# Patient Record
Sex: Female | Born: 1965
Health system: Southern US, Community
[De-identification: ages and names within clinical notes are randomized; demographics above are authoritative.]

## PROBLEM LIST (undated history)

## (undated) DIAGNOSIS — G43909 Migraine, unspecified, not intractable, without status migrainosus: Secondary | ICD-10-CM

## (undated) DIAGNOSIS — M3500 Sicca syndrome, unspecified: Secondary | ICD-10-CM

## (undated) DIAGNOSIS — F32A Depression, unspecified: Secondary | ICD-10-CM

## (undated) DIAGNOSIS — F419 Anxiety disorder, unspecified: Secondary | ICD-10-CM

## (undated) DIAGNOSIS — I7300 Raynaud's syndrome without gangrene: Secondary | ICD-10-CM

## (undated) DIAGNOSIS — E119 Type 2 diabetes mellitus without complications: Secondary | ICD-10-CM

## (undated) DIAGNOSIS — K219 Gastro-esophageal reflux disease without esophagitis: Secondary | ICD-10-CM

## (undated) DIAGNOSIS — M1990 Unspecified osteoarthritis, unspecified site: Secondary | ICD-10-CM

## (undated) DIAGNOSIS — G40909 Epilepsy, unspecified, not intractable, without status epilepticus: Secondary | ICD-10-CM

## (undated) DIAGNOSIS — R42 Dizziness and giddiness: Secondary | ICD-10-CM

## (undated) DIAGNOSIS — E7800 Pure hypercholesterolemia, unspecified: Secondary | ICD-10-CM

## (undated) DIAGNOSIS — E079 Disorder of thyroid, unspecified: Secondary | ICD-10-CM

## (undated) DIAGNOSIS — T7840XA Allergy, unspecified, initial encounter: Secondary | ICD-10-CM

## (undated) DIAGNOSIS — B029 Zoster without complications: Secondary | ICD-10-CM

## (undated) DIAGNOSIS — I10 Essential (primary) hypertension: Secondary | ICD-10-CM

## (undated) HISTORY — DX: Allergy, unspecified, initial encounter: T78.40XA

## (undated) HISTORY — DX: Type 2 diabetes mellitus without complications: E11.9

## (undated) HISTORY — DX: Disorder of thyroid, unspecified: E07.9

## (undated) HISTORY — DX: Depression, unspecified: F32.A

## (undated) HISTORY — DX: Raynaud's syndrome without gangrene: I73.00

## (undated) HISTORY — DX: Essential (primary) hypertension: I10

## (undated) HISTORY — DX: Pure hypercholesterolemia, unspecified: E78.00

## (undated) HISTORY — DX: Epilepsy, unspecified, not intractable, without status epilepticus: G40.909

## (undated) HISTORY — DX: Unspecified osteoarthritis, unspecified site: M19.90

## (undated) HISTORY — DX: Gastro-esophageal reflux disease without esophagitis: K21.9

## (undated) HISTORY — DX: Zoster without complications: B02.9

## (undated) HISTORY — DX: Dizziness and giddiness: R42

## (undated) HISTORY — DX: Migraine, unspecified, not intractable, without status migrainosus: G43.909

## (undated) HISTORY — DX: Sjogren syndrome, unspecified: M35.00

## (undated) HISTORY — DX: Anxiety disorder, unspecified: F41.9

---

## 2007-09-07 ENCOUNTER — Encounter: Payer: Self-pay | Admitting: Gastroenterology

## 2007-09-07 HISTORY — PX: UPPER GI ENDOSCOPY: SHX6162

## 2007-10-04 ENCOUNTER — Encounter: Payer: Self-pay | Admitting: Gastroenterology

## 2007-10-14 HISTORY — PX: CHOLECYSTECTOMY: SHX55

## 2013-06-15 DIAGNOSIS — N200 Calculus of kidney: Secondary | ICD-10-CM

## 2013-06-15 HISTORY — DX: Calculus of kidney: N20.0

## 2015-08-12 ENCOUNTER — Ambulatory Visit (INDEPENDENT_AMBULATORY_CARE_PROVIDER_SITE_OTHER): Payer: BLUE CROSS/BLUE SHIELD | Admitting: Obstetrics and Gynecology

## 2015-08-12 ENCOUNTER — Encounter: Payer: Self-pay | Admitting: Obstetrics and Gynecology

## 2015-08-12 VITALS — BP 138/82 | HR 80 | Resp 16 | Ht 62.5 in | Wt 157.4 lb

## 2015-08-12 DIAGNOSIS — N951 Menopausal and female climacteric states: Secondary | ICD-10-CM

## 2015-08-12 DIAGNOSIS — R42 Dizziness and giddiness: Secondary | ICD-10-CM

## 2015-08-12 DIAGNOSIS — F411 Generalized anxiety disorder: Secondary | ICD-10-CM | POA: Diagnosis not present

## 2015-08-12 NOTE — Patient Instructions (Addendum)
Venlafaxine extended-release capsules  What is this medicine?  VENLAFAXINE(VEN la fax een) is used to treat depression, anxiety and panic disorder.  This medicine may be used for other purposes; ask your health care provider or pharmacist if you have questions.  What should I tell my health care provider before I take this medicine?  They need to know if you have any of these conditions:  -bleeding disorders  -glaucoma  -heart disease  -high blood pressure  -high cholesterol  -kidney disease  -liver disease  -low levels of sodium in the blood  -mania or bipolar disorder  -seizures  -suicidal thoughts, plans, or attempt; a previous suicide attempt by you or a family  -take medicines that treat or prevent blood clots  -thyroid disease  -an unusual or allergic reaction to venlafaxine, desvenlafaxine, other medicines, foods, dyes, or preservatives  -pregnant or trying to get pregnant  -breast-feeding  How should I use this medicine?  Take this medicine by mouth with a full glass of water. Follow the directions on the prescription label. Do not cut, crush, or chew this medicine. Take it with food. If needed, the capsule may be carefully opened and the entire contents sprinkled on a spoonful of cool applesauce. Swallow the applesauce/pellet mixture right away without chewing and follow with a glass of water to ensure complete swallowing of the pellets. Try to take your medicine at about the same time each day. Do not take your medicine more often than directed. Do not stop taking this medicine suddenly except upon the advice of your doctor. Stopping this medicine too quickly may cause serious side effects or your condition may worsen.  A special MedGuide will be given to you by the pharmacist with each prescription and refill. Be sure to read this information carefully each time.  Talk to your pediatrician regarding the use of this medicine in children. Special care may be needed.  Overdosage: If you think you have taken  too much of this medicine contact a poison control center or emergency room at once.  NOTE: This medicine is only for you. Do not share this medicine with others.  What if I miss a dose?  If you miss a dose, take it as soon as you can. If it is almost time for your next dose, take only that dose. Do not take double or extra doses.  What may interact with this medicine?  Do not take this medicine with any of the following medications:  -certain medicines for fungal infections like fluconazole, itraconazole, ketoconazole, posaconazole, voriconazole  -cisapride  -desvenlafaxine  -dofetilide  -dronedarone  -duloxetine  -levomilnacipran  -linezolid  -MAOIs like Carbex, Eldepryl, Marplan, Nardil, and Parnate  -methylene blue (injected into a vein)  -milnacipran  -pimozide  -thioridazine  -ziprasidone  This medicine may also interact with the following medications:  -aspirin and aspirin-like medicines  -certain medicines for depression, anxiety, or psychotic disturbances  -certain medicines for migraine headaches like almotriptan, eletriptan, frovatriptan, naratriptan, rizatriptan, sumatriptan, zolmitriptan  -certain medicines for sleep  -certain medicines that treat or prevent blood clots like dalteparin, enoxaparin, warfarin  -cimetidine  -clozapine  -diuretics  -fentanyl  -furazolidone  -indinavir  -isoniazid  -lithium  -metoprolol  -NSAIDS, medicines for pain and inflammation, like ibuprofen or naproxen  -other medicines that prolong the QT interval (cause an abnormal heart rhythm)  -procarbazine  -rasagiline  -supplements like St. John's wort, kava kava, valerian  -tramadol  -tryptophan  This list may not describe all possible interactions.   care professional for regular checks on your progress. Because it may take several weeks to see the full effects of this medicine, it is important to continue your treatment as prescribed by your doctor. Patients and their families should watch out for new or worsening thoughts of suicide or depression. Also watch out for sudden changes in feelings such as feeling anxious, agitated, panicky, irritable, hostile, aggressive, impulsive, severely restless, overly excited and hyperactive, or not being able to sleep. If this happens, especially at the beginning of treatment or after a change in dose, call your health care professional. This medicine can cause an increase in blood pressure. Check with your doctor for instructions on monitoring your blood pressure while taking this medicine. You may get drowsy or dizzy. Do not drive, use machinery, or do anything that needs mental alertness until you know how this medicine affects you. Do not stand or sit up quickly, especially if you are an older patient. This reduces the risk of dizzy or fainting spells. Alcohol may interfere with the effect of this medicine. Avoid alcoholic drinks. Your mouth may get dry. Chewing sugarless gum, sucking hard candy and drinking plenty of water will help. Contact your doctor if the problem does not go away or is severe. What side effects may I notice from receiving this medicine? Side effects that you should report to your doctor or health care professional as soon as possible: -allergic reactions like skin rash, itching or hives, swelling of the face, lips, or tongue -breathing problems -changes in vision -hallucination, loss of contact with reality -seizures -suicidal thoughts or other mood changes -trouble passing urine or change in the amount of urine -unusual bleeding or bruising Side effects that usually do  not require medical attention (report to your doctor or health care professional if they continue or are bothersome): -change in sex drive or performance -constipation -increased sweating -loss of appetite -nausea -tremors -weight loss This list may not describe all possible side effects. Call your doctor for medical advice about side effects. You may report side effects to FDA at 1-800-FDA-1088. Where should I keep my medicine? Keep out of the reach of children. Store at a controlled temperature between 20 and 25 degrees C (68 degrees and 77 degrees F), in a dry place. Throw away any unused medicine after the expiration date. NOTE: This sheet is a summary. It may not cover all possible information. If you have questions about this medicine, talk to your doctor, pharmacist, or health care provider.    2016, Elsevier/Gold Standard. (2012-12-27 12:46:03)  Menopause and Herbal Products WHAT IS MENOPAUSE? Menopause is the normal time of life when menstrual periods decrease in frequency and eventually stop completely. This process can take several years for some women. Menopause is complete when you have had an absence of menstruation for a full year since your last menstrual period. It usually occurs between the ages of 30 and 20. It is not common for menopause to begin before the age of 94. During menopause, your body stops producing the female hormones estrogen and progesterone. Common symptoms associated with this loss of hormones (vasomotor symptoms) are:  Hot flashes.  Hot flushes.  Night sweats. Other common symptoms and complications of menopause include:  Decrease in sex drive.  Vaginal dryness and thinning of the walls of the vagina. This can make sex painful.  Dryness of the skin and development of wrinkles.  Headaches.  Tiredness.  Irritability.  Memory problems.  Weight gain.  Bladder infections.  Hair growth on the face and chest.  Inability to reproduce  offspring (infertility).  Loss of density in the bones (osteoporosis) increasing your risk for breaks (fractures).  Depression.  Hardening and narrowing of the arteries (atherosclerosis). This increases your risk of heart attack and stroke. WHAT TREATMENT OPTIONS ARE AVAILABLE? There are many treatment choices for menopause symptoms. The most common treatment is hormone replacement therapy. Many alternative therapies for menopause are emerging, including the use of herbal products. These supplements can be found in the form of herbs, teas, oils, tinctures, and pills. Common herbal supplements for menopause are made from plants that contain phytoestrogens. Phytoestrogens are compounds that occur naturally in plants and plant products. They act like estrogen in the body. Foods and herbs that contain phytoestrogens include:  Soy.  Flax seeds.  Red clover.  Ginseng. WHAT MENOPAUSE SYMPTOMS MAY BE HELPED IF I USE HERBAL PRODUCTS?  Vasomotor symptoms. These may be helped by:  Soy. Some studies show that soy may have a moderate benefit for hot flashes.  Black cohosh. There is limited evidence indicating this may be beneficial for hot flashes.  Symptoms that are related to heart and blood vessel disease. These may be helped by soy. Studies have shown that soy can help to lower cholesterol.  Depression. This may be helped by:  St. John's wort. There is limited evidence that shows this may help mild to moderate depression.  Black cohosh. There is evidence that this may help depression and mood swings.  Osteoporosis. Soy may help to decrease bone loss that is associated with menopause and may prevent osteoporosis. Limited evidence indicates that red clover may offer some bone loss protection as well. Other herbal products that are commonly used during menopause lack enough evidence to support their use as a replacement for conventional menopause therapies. These products include evening  primrose, ginseng, and red clover. WHAT ARE THE CASES WHEN HERBAL PRODUCTS SHOULD NOT BE USED DURING MENOPAUSE? Do not use herbal products during menopause without your health care provider's approval if:  You are taking medicine.  You have a preexisting liver condition. ARE THERE ANY RISKS IN MY TAKING HERBAL PRODUCTS DURING MENOPAUSE? If you choose to use herbal products to help with symptoms of menopause, keep in mind that:  Different supplements have different and unmeasured amounts of herbal ingredients.  Herbal products are not regulated the same way that medicines are.  Concentrations of herbs may vary depending on the way they are prepared. For example, the concentration may be different in a pill, tea, oil, and tincture.  Little is known about the risks of using herbal products, particularly the risks of long-term use.  Some herbal supplements can be harmful when combined with certain medicines. Most commonly reported side effects of herbal products are mild. However, if used improperly, many herbal supplements can cause serious problems. Talk to your health care provider before starting any herbal product. If problems develop, stop taking the supplement and let your health care provider know.   This information is not intended to replace advice given to you by your health care provider. Make sure you discuss any questions you have with your health care provider.   Document Released: 11/18/2007 Document Revised: 06/22/2014 Document Reviewed: 11/14/2013 Elsevier Interactive Patient Education [employer].

## 2015-08-12 NOTE — Progress Notes (Signed)
Patient ID: Chloe Pearson, female   DOB: 10-Sep-1965, 50 y.o.   MRN: [id number] GYNECOLOGY  VISIT   HPI: 50 y.o.   Single  Caucasian  female   [id number] with Patient's last menstrual period was 02/14/2015 (approximate).   here for consultation regarding menopausal issues. Feels "done" with doctors.  She brings in multiple records. Absolutely declines any examination today. Was referred to me by another patient.   1.  Concerned about her hormones. Was on HRT therapy for about 4 - 5 years, and would still have menses.  Tried multiple different forms of HRT and even low dose OCPs. Stopped LoLoEstrin Fe on March 01, 2015.  Spotting for one week in September 2016 with stopping the pills.  Prior LMP was April 2016.  Patient would have elevated blood pressure when she was on HRT.   Having hot flashes, night sweats. Declines any further hormonal treatment.  States she takes Metformin for glucose intolerance and for her hot flashes.   2.  Biggest issue today is migraine with vertigo instead of really a headache. This is what she is really here for today.  Takes OTC Meclizine, Zofran.   Sees provider at [place] at the vestibular clinic for the vertigo.  Has seen ENT and has normal hearing and no inner ear issues.  Has has imaging of the brain which is normal.   3.  Thinks she has endometriosis.  Had rectal bleeding with her menses.  No further rectal bleeding since she has stopped her cycles.  Wondering if hysterectomy would solve everything.   Saw Dr. Gaetano Net at [facility]. Normal pelvic ultrasound in March 2015.  EMB fragmented proliferative endometrium.   CT scan last February - scattered diverticula, renal stones, and hiatal hernia - type I.  4.  Having "surges." Feels something wanting to flow (menstrual blood) and it does not.  Lasts for days.  Not painful.  Has elevated blood pressure when this occurs.   Feels like she has the surges, elevated blood pressure and  vertigo at the same time.   5.  Had recent blood work with her PCP.  FSH 76.6 and estradiol < 0.5 on 07/24/15.  TSH 4.080.   6.  Feels she has some anxiety also since menopause.  Tried Birsdelle in the past for menopausal symptoms, and this did not work well.   GYNECOLOGIC HISTORY: Patient's last menstrual period was 02/14/2015 (approximate). Contraception:condoms Menopausal hormone therapy: none Last mammogram: 2016 normal;[facility] Last pap smear: 02/2014 normal per patient        OB History    Gravida Para Term Preterm AB TAB SAB Ectopic Multiple Living   0 0 0 0 0 0 0 0 0 0          There are no active problems to display for this patient.   Past Medical History  Diagnosis Date  . Anxiety   . Sjogren's disease ([place])   . Raynaud's disease   . Thyroid disease     related to Sjogrens  . Hypertension   . Vertigo   . Epilepsy [facility])     age 36 to 54    Past Surgical History  Procedure Laterality Date  . Cholecystectomy      Current Outpatient Prescriptions  Medication Sig Dispense Refill  . ALPRAZolam (XANAX) 0.25 MG tablet Take 1 tablet by mouth 2 (two) times daily.    . Blood Glucose Monitoring Suppl (ONE TOUCH ULTRA 2) w/Device KIT USE TO CHECK SUGAR TWICE DAILY  0  . BYSTOLIC 5 MG tablet 1/2 tab. Twice daly    . furosemide (LASIX) 20 MG tablet Take 1 tablet by mouth as needed.    . hydroxychloroquine (PLAQUENIL) 200 MG tablet Take 1 tablet by mouth daily.    Marland Kitchen levothyroxine (SYNTHROID, LEVOTHROID) 50 MCG tablet Take 50 mcg by mouth daily.  4  . metFORMIN (GLUCOPHAGE-XR) 500 MG 24 hr tablet Take 1 tablet by mouth daily.  5  . ondansetron (ZOFRAN-ODT) 4 MG disintegrating tablet Take 1 tablet by mouth as needed.    . pravastatin (PRAVACHOL) 20 MG tablet Take 20 mg by mouth daily.  3  . Vitamin D, Ergocalciferol, (DRISDOL) 50000 units CAPS capsule Take 50,000 Units by mouth once a week. Takes every other week  4   No current facility-administered medications  for this visit.     ALLERGIES: Oxycodone; Shellfish allergy; Cefaclor; and Penicillins  Family History  Problem Relation Age of Onset  . Osteoarthritis Mother   . Diabetes Mother   . Hypertension Mother   . Hyperlipidemia Mother   . Diabetes Father   . Hyperlipidemia Father   . Diabetes Paternal Aunt   . Diabetes Paternal Uncle   . Hypertension Maternal Grandmother   . Hyperlipidemia Maternal Grandmother   . Stroke Maternal Grandmother   . Diabetes Maternal Grandfather   . Hyperlipidemia Maternal Grandfather   . Hyperlipidemia Paternal Grandmother   . Hyperlipidemia Paternal Grandfather   . Stroke Paternal Grandfather   . Cancer Other   . Cancer Cousin     Social History   Social History  . Marital Status: Single    Spouse Name: N/A  . Number of Children: N/A  . Years of Education: N/A   Occupational History  . Not on file.   Social History Main Topics  . Smoking status: Former Smoker    Types: Cigarettes    Quit date: 08/13/2001  . Smokeless tobacco: Not on file  . Alcohol Use: No  . Drug Use: No  . Sexual Activity: No     Comment: condoms when needed   Other Topics Concern  . Not on file   Social History Narrative  . No narrative on file    ROS:  Pertinent items are noted in HPI.  PHYSICAL EXAMINATION:    BP 138/82 mmHg  Pulse 80  Resp 16  Ht 5' 2.5" (1.588 m)  Wt 157 lb 6.4 oz (71.396 kg)  BMI 28.31 kg/m2  LMP 02/14/2015 (Approximate)    General appearance: alert, cooperative and appears stated age.    Examination declined by patient.    ASSESSMENT  Menopausal symptoms. Labs support menopause but patient has not gone one full year without a cycle. Vertigo.   Migraines with vertigo.  Hx of rectal bleeding with prior menses.  Resolved now with cessation of menses. Anxiety and frustration.   PLAN  Very comprehensive discussion regarding menopausal symptoms, endometriosis, and vertigo.  We discussed options for treating menopausal  symptoms with HRT, SSRIs and SNRIs (offered Effexor XR which was declined), and herbal options.  We reviewed hysterectomy as a valuable surgical procedure when a gynecologic disease is diagnosed or strongly suspected following evaluation.  I offered a pelvic ultrasound to evaluate her anatomy.  She declines this as she states nothing had changed symptom wise. We did a literature search together on Up to Date about vertigo treatment, and I gave the patient a copy of this after we discussed it. I suggested she pursue another option from  a neurologist as she does not have this specialist on her team treating her migraines anymore.  Return prn.   An After Visit Summary was printed and given to the patient.  __60____ minutes face to face time of which over 50% was spent in counseling.

## 2015-08-26 ENCOUNTER — Ambulatory Visit (INDEPENDENT_AMBULATORY_CARE_PROVIDER_SITE_OTHER): Payer: BLUE CROSS/BLUE SHIELD | Admitting: Neurology

## 2015-08-26 ENCOUNTER — Encounter: Payer: Self-pay | Admitting: Neurology

## 2015-08-26 VITALS — BP 144/89 | HR 86 | Ht 62.0 in | Wt 158.0 lb

## 2015-08-26 DIAGNOSIS — E079 Disorder of thyroid, unspecified: Secondary | ICD-10-CM | POA: Diagnosis not present

## 2015-08-26 DIAGNOSIS — R42 Dizziness and giddiness: Secondary | ICD-10-CM

## 2015-08-26 DIAGNOSIS — M3500 Sicca syndrome, unspecified: Secondary | ICD-10-CM

## 2015-08-26 DIAGNOSIS — I10 Essential (primary) hypertension: Secondary | ICD-10-CM

## 2015-08-26 DIAGNOSIS — Z8669 Personal history of other diseases of the nervous system and sense organs: Secondary | ICD-10-CM

## 2015-08-26 DIAGNOSIS — F411 Generalized anxiety disorder: Secondary | ICD-10-CM | POA: Diagnosis not present

## 2015-08-26 DIAGNOSIS — G43109 Migraine with aura, not intractable, without status migrainosus: Secondary | ICD-10-CM | POA: Diagnosis not present

## 2015-08-26 DIAGNOSIS — I7300 Raynaud's syndrome without gangrene: Secondary | ICD-10-CM

## 2015-08-26 DIAGNOSIS — E785 Hyperlipidemia, unspecified: Secondary | ICD-10-CM

## 2015-08-26 DIAGNOSIS — G43809 Other migraine, not intractable, without status migrainosus: Secondary | ICD-10-CM

## 2015-08-26 MED ORDER — SUMATRIPTAN SUCCINATE 100 MG PO TABS: 100.0000 mg | ORAL_TABLET | Freq: Once | ORAL | Status: DC | PRN

## 2015-08-26 MED ORDER — DIVALPROEX SODIUM ER 250 MG PO TB24: 250.0000 mg | ORAL_TABLET | Freq: Every day | ORAL | Status: DC

## 2015-08-26 MED ORDER — SCOPOLAMINE 1 MG/3DAYS TD PT72: 1.0000 | MEDICATED_PATCH | TRANSDERMAL | Status: DC

## 2015-08-26 NOTE — Progress Notes (Addendum)
[facility]    Provider:  Dr Jaynee Eagles Referring Provider: Ernestene Kiel, MD Primary Care Physician:  Ernestene Kiel, MD  CC:  Vertigo  HPI:  Chloe Pearson is a 50 y.o. female here as a referral from Dr. Laqueta Due for 10 years of vertigo. Past medical history hypertension, high cholesterol, migraine, anxiety, thyroid disease, migraine.  She also has Sjogren's and Raynaud's syndrome and a history of epilepsy as a child. She has been to [facility] and to multiple neurologists. Getting worse as she gets closer to menopause. She was diagnosed with vestibular migraines with vertigo. Dizziness has occurred with migraines. The headaches are with photophobia and phonophobia. She feels like she is spinning. She feels like she is leaning to the right and isn't, like she is on a tilt-a-whirl. The vertigo will start slowly and progress over days then last all day long and all night and then slowly decrease over a week. This happenens over 2 weeks a month she will have varying amounts of vertigo. Worsening. Almost fell three times last Monday. No hearing changes. No food triggers or any triggers. Every once in a while she has migraines with the vertigo.She has had topamax and made her fingers tingle. OTC meclizine and zofran when it is really bad.  She gets arm tingling in the fingers with the headaches. Nothing makes the symptoms better. No inciting events or head trauma. No other focal neurologic deficits or symptoms. She has been evaluated by ENT without etiology for her vertigo, and she has had imaging of the brain which is normal.  Reviewed notes, labs and imaging from outside physicians, which showed: Patient was seen by OB/GYN on fibular 27th 2017. She reported being on hormone therapy for about 4-5 years with ongoing menses. Tried different forms of hormone replacement therapy and even low doses of OCPs. Last menstrual period was April 2016. She is having hot flashes and night sweats.  Declines any further hormonal treatment. She takes metformin for glucose intolerance and for her hot flashes. She was seen at [facility] at the vestibular clinic for her vertigo. She seen an ear nose and throat has normal hearing and no inner ear issues. She has had imaging of the brain which is normal. Normal pelvic ultrasound in March 2015. Fragmented pleura furtive endometrium. CT scan last February with scattered diverticula, renal stones and hiatal hernia type I. OB/GYN, Dr. Judeth Horn, discussed medications such as SSRIs and Effexor which were declined. She was offered pelvic ultrasound and declined.  TSH 4.080. Follows with primary care for thyroid management.  MRI of the brain from [facility], reviewed report. Stable and normal cerebral volume. Major intracranial vascular flow voids are stable within normal limits. No restricted diffusion. No midline shift, mass effect, evidence of mass lesion, ventriculomegaly, extra-axial collection or acute intracranial hemorrhage. Cervical medullary junction and pituitary are within normal limits. Negative visualized cervical spine. Gray-white matter signals within normal limits throughout the brain. No abnormal enhancement identified. Stable in normal MRI appearance of the brain. 03/14/2013.  Echocardiogram 03/14/2013. Normal left ventricular size and systolic function. Trace aortic and mitral insufficiency with no significant structural abnormalities identified. Mild TR, normal pulmonary artery pressure suggested. Aortic root and arch appear normal, no aneurysms or dissection, generally unremarkable echocardiographic study.  Bilateral carotid duplex ultrasound performed at [facility] on 01/02/2010. Mild amount of plaque or intimal thickening in the carotid arteries. No significant carotid artery stenosis. Estimated degree of stenosis in the internal carotid arteries is 50-69% based on the peak systolic velocities  which is likely an over estimation and  probably less than 50%.  Labs from 07/24/2015: Normal CBC and normal CMP. Creatinine 0.85. CK 96. Hemoglobin A1c 6.2. LDL 121. Sedimentation rate 3. TSH 4.080. Vitamin D 25-hydroxy normal.  Review of Systems: Patient complains of symptoms per HPI as well as the following symptoms: Weight gain, fatigue, blurred vision, easy bruising, feeling hot, feeling cold, joint pain, diarrhea, constipation number ringing in ears, spinning sensation, itching, allergies, skin sensitivity, headache, numbness, dizziness, anxiety, decreased energy, sleepiness. Pertinent negatives per HPI. All others negative.   Social History   Social History  . Marital Status: Single    Spouse Name: N/A  . Number of Children: 0  . Years of Education: 13   Occupational History  . [employer]    Social History Main Topics  . Smoking status: Former Smoker    Types: Cigarettes    Quit date: 08/13/2001  . Smokeless tobacco: Not on file  . Alcohol Use: 0.0 oz/week    0 Standard drinks or equivalent per week     Comment: rare  . Drug Use: No  . Sexual Activity: No     Comment: condoms when needed   Other Topics Concern  . Not on file   Social History Narrative   Lives alone   Caffeine use: Coffee-1 cup per day (2 cups on Friday)    Family History  Problem Relation Age of Onset  . Osteoarthritis Mother   . Diabetes Mother   . Hypertension Mother   . Hyperlipidemia Mother   . Diabetes Father   . Hyperlipidemia Father   . Diabetes Paternal Aunt   . Diabetes Paternal Uncle   . Hypertension Maternal Grandmother   . Hyperlipidemia Maternal Grandmother   . Stroke Maternal Grandmother   . Diabetes Maternal Grandfather   . Hyperlipidemia Maternal Grandfather   . Hyperlipidemia Paternal Grandmother   . Hyperlipidemia Paternal Grandfather   . Stroke Paternal Grandfather   . Cancer Other   . Cancer Cousin   . Migraines Neg Hx     Past Medical History  Diagnosis Date  . Anxiety   . Sjogren's disease ([place])    . Raynaud's disease   . Thyroid disease     related to Sjogrens  . Hypertension   . Vertigo   . Epilepsy [facility])     age 38 to 12  . Hypertension   . Migraine   . High cholesterol     Past Surgical History  Procedure Laterality Date  . Cholecystectomy  about 2007    Current Outpatient Prescriptions  Medication Sig Dispense Refill  . acetaminophen (TYLENOL) 500 MG tablet Take 500 mg by mouth every 6 (six) hours as needed.    . ALPRAZolam (XANAX) 0.25 MG tablet Take 1 tablet by mouth 2 (two) times daily.    . Blood Glucose Monitoring Suppl (ONE TOUCH ULTRA 2) w/Device KIT USE TO CHECK SUGAR TWICE DAILY  0  . BYSTOLIC 5 MG tablet 1/2 tab. Twice daly    . furosemide (LASIX) 20 MG tablet Take 1 tablet by mouth as needed.    . hydroxychloroquine (PLAQUENIL) 200 MG tablet Take 1 tablet by mouth daily.    Marland Kitchen levothyroxine (SYNTHROID, LEVOTHROID) 50 MCG tablet Take 50 mcg by mouth daily.  4  . metFORMIN (GLUCOPHAGE-XR) 500 MG 24 hr tablet Take 1 tablet by mouth daily.  5  . ondansetron (ZOFRAN-ODT) 4 MG disintegrating tablet Take 1 tablet by mouth as needed.    Marland Kitchen  pravastatin (PRAVACHOL) 20 MG tablet Take 20 mg by mouth daily.  3  . Vitamin D, Ergocalciferol, (DRISDOL) 50000 units CAPS capsule Take 50,000 Units by mouth once a week. Takes every other week  4  . divalproex (DEPAKOTE ER) 250 MG 24 hr tablet Take 1 tablet (250 mg total) by mouth daily. 30 tablet 12  . scopolamine (TRANSDERM-SCOP) 1 MG/3DAYS Place 1 patch (1.5 mg total) onto the skin every 3 (three) days. 10 patch 12  . SUMAtriptan (IMITREX) 100 MG tablet Take 1 tablet (100 mg total) by mouth once as needed for migraine. May repeat in 2 hours if headache persists or recurs. 12 tablet 11   No current facility-administered medications for this visit.    Allergies as of 08/26/2015 - Review Complete 08/26/2015  Allergen Reaction Noted  . Oxycodone Itching 08/12/2015  . Shellfish allergy Swelling 08/12/2015  . Cefaclor Rash  08/12/2015  . Penicillins Rash 08/12/2015    Vitals: BP 144/89 mmHg  Pulse 86  Ht _0  (1.575 m)  Wt 158 lb (71.668 kg)  BMI 28.89 kg/m2  LMP 02/14/2015 (Approximate) Last Weight:  Wt Readings from Last 1 Encounters:  08/26/15 158 lb (71.668 kg)   Last Height:   Ht Readings from Last 1 Encounters:  08/26/15 _1  (1.575 m)    Physical exam: Exam: Gen: NAD, conversant, well nourised, obese, well groomed                     CV: RRR, no MRG. No Carotid Bruits. No peripheral edema, warm, nontender Eyes: Conjunctivae clear without exudates or hemorrhage  Neuro: Detailed Neurologic Exam  Speech:    Speech is normal; fluent and spontaneous with normal comprehension.  Cognition:    The patient is oriented to person, place, and time;     recent and remote memory intact;     language fluent;     normal attention, concentration,     fund of knowledge Cranial Nerves:    The pupils are equal, round, and reactive to light. The fundi are normal and spontaneous venous pulsations are present. Visual fields are full to finger confrontation. Extraocular movements are intact. Trigeminal sensation is intact and the muscles of mastication are normal. The face is symmetric. The palate elevates in the midline. Hearing intact. Voice is normal. Shoulder shrug is normal. The tongue has normal motion without fasciculations.   Coordination:    Normal finger to nose and heel to shin. Normal rapid alternating movements.   Gait:    Heel-toe and tandem gait are normal.   Motor Observation:    No asymmetry, no atrophy, and no involuntary movements noted. Tone:    Normal muscle tone.    Posture:    Posture is normal. normal erect    Strength:    Strength is V/V in the upper and lower limbs.      Sensation: intact to LT     Reflex Exam:  DTR's:    Deep tendon reflexes in the upper and lower extremities are normal bilaterally.   Toes:    The toes are downgoing bilaterally.   Clonus:     Clonus is absent.      Assessment/Plan:  50 year old patient with 10 year history of vertigo associated with migraines. She has been evaluated by multiple neurologists and the [facility] vestibular clinic. She's been diagnosed with vestibular migraines.  vestibular therapy: [employer] on [address] in [place] at onset of symptoms  As far as your  medications are concerned, I would like to suggest: Imitrex at the onset of symptoms. Can repeat it in 2 hours. No more than 2 in a day. Durin the week of menses when vertigo occurs, may take 2 daily for up to 5 days. Scopolamine patch when symptoms are severe.  Depakote 259m at night before bed. Can increase to 5036m   Depakote: She is not planning on having any children, she is menopausal. Discussed teratogenicity of Depakote, do not get pregnant, use BC. Discussed side effects and provided an UpToDate patient drug information handout. Side effects of Depakote include drowsiness, weakness, nausea, vomiting, stomach upset, diarrhea, constipation, mood swings, changes in menstrual periods, enlarged breasts, weight changes, agitation, tremor (shaking), vision changes, unusual or unpleasant taste in your mouth, and hair loss.  Triptan: The most common side-effects are feeling sick (nausea), dizziness and dry mouth. In addition, triptans can also cause some people to experience strange sensations. These may be a tightness, tingling, flushing, and feelings of heaviness or pressure in areas such as the face and limbs, and occasionally the chest. Serious side effects can include stroke, cardiac side effects such as chest tightness, shortness of breath and possible cardiovascular adverse effects.   Addendum 10/01/2015: [facility] MRI of the head with and without contrast showed stable in normal cerebral volume, major intracranial vascular flow voids are stable and within normal limits,  no restricted diffusion to suggest acute infarct, no midline shift, mass effect, no evidence of mass lesion, ventriculomegaly, extra-axial collection or acute intracranial hemorrhage. Cervical medullary junction and pituitary are within normal limits. Negative visualized cervical spine. Gray-white matter signal is within normal limits throughout the brain. No abnormal enhancement is identified. Visualized orbit soft tissues are within normal limits. Visualized para nasal sinuses and mastoids are clear. Visible internal auditory structures appear normal. Negative scalp soft tissue, bone marrow signal is normal. Pearson 09/10/2009. Stable in normal MRI appearance of the brain.  Same-day upper extremity artery unilateral: Unremarkable sonographic appearance of the bilateral subclavian arteries. Ordered for intermittent dizziness of 5 years worsened over the past month. She presented for bilateral duplex carotid ultrasonography was found to have an asymmetric midline bruit concerning for possible subclavian stenosis. Limited evaluation of the bilateral upper extremities, subclavian arteries, was performed in addition to the carotid duplex study. Bilateral carotid duplex ultrasound comparison 01/02/2010 no evidence for carotid artery stenosis and no significant plaque identified.  CC: Dr. PrFonnie BirkenheadMD  [facility] [address] [address]Phone [phone]

## 2015-08-26 NOTE — Patient Instructions (Addendum)
Remember to drink plenty of fluid, eat healthy meals and do not skip any meals. Try to eat protein with a every meal and eat a healthy snack such as fruit or nuts in between meals. Try to keep a regular sleep-wake schedule and try to exercise daily, particularly in the form of walking, 20-30 minutes a day, if you can.   As far as your medications are concerned, I would like to suggest: Imitrex at the onset of symptoms. Can repeat it in 2 hours. No more than 2 in a day. Scopolamine patch when symptoms are severe.  Depakote 250mg  at night before bed  Our phone number is [phone]. We also have an after hours call service for urgent matters and there is a physician on-call for urgent questions. For any emergencies you know to call 911 or go to the nearest emergency room

## 2015-08-30 ENCOUNTER — Encounter: Payer: Self-pay | Admitting: Neurology

## 2015-08-30 DIAGNOSIS — I10 Essential (primary) hypertension: Secondary | ICD-10-CM | POA: Insufficient documentation

## 2015-08-30 DIAGNOSIS — G43809 Other migraine, not intractable, without status migrainosus: Secondary | ICD-10-CM | POA: Insufficient documentation

## 2015-08-30 DIAGNOSIS — E079 Disorder of thyroid, unspecified: Secondary | ICD-10-CM | POA: Insufficient documentation

## 2015-08-30 DIAGNOSIS — I7300 Raynaud's syndrome without gangrene: Secondary | ICD-10-CM | POA: Insufficient documentation

## 2015-08-30 DIAGNOSIS — G43109 Migraine with aura, not intractable, without status migrainosus: Principal | ICD-10-CM

## 2015-08-30 DIAGNOSIS — F411 Generalized anxiety disorder: Secondary | ICD-10-CM | POA: Insufficient documentation

## 2015-08-30 DIAGNOSIS — M3500 Sicca syndrome, unspecified: Secondary | ICD-10-CM | POA: Insufficient documentation

## 2015-08-30 DIAGNOSIS — Z8669 Personal history of other diseases of the nervous system and sense organs: Secondary | ICD-10-CM | POA: Insufficient documentation

## 2015-08-30 DIAGNOSIS — E785 Hyperlipidemia, unspecified: Secondary | ICD-10-CM | POA: Insufficient documentation

## 2015-09-01 ENCOUNTER — Encounter: Payer: Self-pay | Admitting: Neurology

## 2015-09-04 ENCOUNTER — Other Ambulatory Visit: Payer: Self-pay | Admitting: Neurology

## 2015-09-04 MED ORDER — RIZATRIPTAN BENZOATE 10 MG PO TBDP: 10.0000 mg | ORAL_TABLET | Freq: Once | ORAL | Status: DC

## 2015-11-26 ENCOUNTER — Ambulatory Visit (INDEPENDENT_AMBULATORY_CARE_PROVIDER_SITE_OTHER): Payer: BLUE CROSS/BLUE SHIELD | Admitting: Neurology

## 2015-11-26 VITALS — BP 146/94 | HR 80 | Ht 62.0 in | Wt 161.0 lb

## 2015-11-26 DIAGNOSIS — R0683 Snoring: Secondary | ICD-10-CM

## 2015-11-26 DIAGNOSIS — R400 Somnolence: Secondary | ICD-10-CM

## 2015-11-26 DIAGNOSIS — R51 Headache: Secondary | ICD-10-CM | POA: Diagnosis not present

## 2015-11-26 DIAGNOSIS — G43109 Migraine with aura, not intractable, without status migrainosus: Secondary | ICD-10-CM | POA: Diagnosis not present

## 2015-11-26 DIAGNOSIS — G4710 Hypersomnia, unspecified: Secondary | ICD-10-CM

## 2015-11-26 DIAGNOSIS — G43809 Other migraine, not intractable, without status migrainosus: Secondary | ICD-10-CM

## 2015-11-26 DIAGNOSIS — R42 Dizziness and giddiness: Secondary | ICD-10-CM | POA: Diagnosis not present

## 2015-11-26 DIAGNOSIS — R519 Headache, unspecified: Secondary | ICD-10-CM

## 2015-11-26 MED ORDER — DIVALPROEX SODIUM ER 250 MG PO TB24: 500.0000 mg | ORAL_TABLET | Freq: Every day | ORAL | Status: DC

## 2015-11-26 NOTE — Progress Notes (Addendum)
[facility]    Provider:  Dr Jaynee Eagles Referring Provider: Ernestene Kiel, MD Primary Care Physician:  Ernestene Kiel, MD  CC: Vertigo  Addendum: had 5 treatments of vestibular therapy with improvement.   Interval history 11/26/2015: 50 year old patient with 10 year history of vertigo associated with migraines. She has been evaluated by multiple neurologists and the [facility] vestibular clinic. She's been diagnosed with vestibular migraines.The Depakote has helped she doesn't get the roller coaster feeling. The Imitrex increased BP.  Since march she has had 5 a month on average with the vertigo. The scopolamine patch was too expensive. The Depakote seems to be helping a little bit we discussed vestibular migraines or basilar type migraines. Patient suffered from these for many years and she's been evaluated multiple times even up to do vestibular clinic. Discussed other options. She does have Xanax which is a very good vestibular suppressant, I recommend trying that when she has the dizzy vertigo spells. Reglan is something else we can try. At this point we'll increase her Depakote and see if that helps with prevention.  Medications tried: Depakote (on now), tried multiple triptans and intolerant, Topamax made her fingers hurt and could not try it, lexapro (did not tolerate). Takes meclizine and zofran and xanax for symptoms of vertigo/dizziness. Acutely she takes tylenol and meclizine or zofran. Scopolamine was too expensive and didn't try it.    HPI: Chloe Pearson is a 50 y.o. female here as a referral from Dr. Laqueta Due for 10 years of vertigo. Past medical history hypertension, high cholesterol, migraine, anxiety, thyroid disease, migraine. She also has Sjogren's and Raynaud's syndrome and a history of epilepsy as a child. She has been to [facility] and to multiple neurologists. Getting worse as she gets closer to menopause. She was diagnosed with vestibular migraines with vertigo.  Dizziness has occurred with migraines. The headaches are with photophobia and phonophobia. She feels like she is spinning. She feels like she is leaning to the right and isn't, like she is on a tilt-a-whirl. The vertigo will start slowly and progress over days then last all day long and all night and then slowly decrease over a week. This happenens over 2 weeks a month she will have varying amounts of vertigo. Worsening. Almost fell three times last Monday. No hearing changes. No food triggers or any triggers. Every once in a while she has migraines with the vertigo.She has had topamax and made her fingers tingle. OTC meclizine and zofran when it is really bad. She gets arm tingling in the fingers with the headaches. Nothing makes the symptoms better. No inciting events or head trauma. No other focal neurologic deficits or symptoms. She has been evaluated by ENT without etiology for her vertigo, and she has had imaging of the brain which is normal.  Reviewed notes, labs and imaging from outside physicians, which showed: Patient was seen by OB/GYN on fibular 27th 2017. She reported being on hormone therapy for about 4-5 years with ongoing menses. Tried different forms of hormone replacement therapy and even low doses of OCPs. Last menstrual period was April 2016. She is having hot flashes and night sweats. Declines any further hormonal treatment. She takes metformin for glucose intolerance and for her hot flashes. She was seen at [facility] at the vestibular clinic for her vertigo. She seen an ear nose and throat has normal hearing and no inner ear issues. She has had imaging of the brain which is normal. Normal pelvic ultrasound in March 2015. Fragmented pleura furtive endometrium.  CT scan last February with scattered diverticula, renal stones and hiatal hernia type I. OB/GYN, Dr. Judeth Horn, discussed medications such as SSRIs and Effexor which were declined. She was offered pelvic ultrasound and declined.  TSH 4.080.  Follows with primary care for thyroid management.  MRI of the brain from [facility], reviewed report. Stable and normal cerebral volume. Major intracranial vascular flow voids are stable within normal limits. No restricted diffusion. No midline shift, mass effect, evidence of mass lesion, ventriculomegaly, extra-axial collection or acute intracranial hemorrhage. Cervical medullary junction and pituitary are within normal limits. Negative visualized cervical spine. Gray-white matter signals within normal limits throughout the brain. No abnormal enhancement identified. Stable in normal MRI appearance of the brain. 03/14/2013.  Echocardiogram 03/14/2013. Normal left ventricular size and systolic function. Trace aortic and mitral insufficiency with no significant structural abnormalities identified. Mild TR, normal pulmonary artery pressure suggested. Aortic root and arch appear normal, no aneurysms or dissection, generally unremarkable echocardiographic study.  Bilateral carotid duplex ultrasound performed at [facility] on 01/02/2010. Mild amount of plaque or intimal thickening in the carotid arteries. No significant carotid artery stenosis. Estimated degree of stenosis in the internal carotid arteries is 50-69% based on the peak systolic velocities which is likely an over estimation and probably less than 50%.  Labs from 07/24/2015: Normal CBC and normal CMP. Creatinine 0.85. CK 96. Hemoglobin A1c 6.2. LDL 121. Sedimentation rate 3. TSH 4.080. Vitamin D 25-hydroxy normal.  Review of Systems: Patient complains of symptoms per HPI as well as the following symptoms: Weight gain, fatigue, blurred vision, easy bruising, feeling hot, feeling cold, joint pain, diarrhea, constipation number ringing in ears, spinning sensation, itching, allergies, skin sensitivity, headache, numbness, dizziness, anxiety, decreased energy, sleepiness. Pertinent negatives per HPI. All others negative.  Social History     Social History  . Marital Status: Single    Spouse Name: N/A  . Number of Children: 0  . Years of Education: 13   Occupational History  . [employer]    Social History Main Topics  . Smoking status: Former Smoker    Types: Cigarettes    Quit date: 08/13/2001  . Smokeless tobacco: Not on file  . Alcohol Use: 0.0 oz/week    0 Standard drinks or equivalent per week     Comment: rare  . Drug Use: No  . Sexual Activity: No     Comment: condoms when needed   Other Topics Concern  . Not on file   Social History Narrative   Lives alone   Caffeine use: Coffee-1 cup per day (2 cups on Friday)    Family History  Problem Relation Age of Onset  . Osteoarthritis Mother   . Diabetes Mother   . Hypertension Mother   . Hyperlipidemia Mother   . Diabetes Father   . Hyperlipidemia Father   . Diabetes Paternal Aunt   . Diabetes Paternal Uncle   . Hypertension Maternal Grandmother   . Hyperlipidemia Maternal Grandmother   . Stroke Maternal Grandmother   . Diabetes Maternal Grandfather   . Hyperlipidemia Maternal Grandfather   . Hyperlipidemia Paternal Grandmother   . Hyperlipidemia Paternal Grandfather   . Stroke Paternal Grandfather   . Cancer Other   . Cancer Cousin   . Migraines Neg Hx     Past Medical History  Diagnosis Date  . Anxiety   . Sjogren's disease ([place])   . Raynaud's disease   . Thyroid disease     related to Sjogrens  . Hypertension   .  Vertigo   . Epilepsy [facility])     age 34 to 80  . Hypertension   . Migraine   . High cholesterol     Past Surgical History  Procedure Laterality Date  . Cholecystectomy  about 2007    Current Outpatient Prescriptions  Medication Sig Dispense Refill  . acetaminophen (TYLENOL) 500 MG tablet Take 500 mg by mouth every 6 (six) hours as needed.    . ALPRAZolam (XANAX) 0.25 MG tablet Take 1 tablet by mouth 2 (two) times daily.    . Blood Glucose Monitoring Suppl (ONE TOUCH ULTRA 2) w/Device KIT USE TO CHECK SUGAR TWICE  DAILY  0  . BYSTOLIC 5 MG tablet 1/2 tab. Twice daly    . divalproex (DEPAKOTE ER) 250 MG 24 hr tablet Take 1 tablet (250 mg total) by mouth daily. 30 tablet 12  . furosemide (LASIX) 20 MG tablet Take 1 tablet by mouth as needed.    . hydroxychloroquine (PLAQUENIL) 200 MG tablet Take 1 tablet by mouth daily.    Marland Kitchen levothyroxine (SYNTHROID, LEVOTHROID) 75 MCG tablet Take 75 mcg by mouth daily.  4  . metFORMIN (GLUCOPHAGE-XR) 500 MG 24 hr tablet Take 2 tablets by mouth 2 (two) times daily.   5  . ondansetron (ZOFRAN-ODT) 4 MG disintegrating tablet Take 1 tablet by mouth as needed.    . pravastatin (PRAVACHOL) 20 MG tablet Take 20 mg by mouth daily.  3  . Vitamin D, Ergocalciferol, (DRISDOL) 50000 units CAPS capsule Take 50,000 Units by mouth once a week. Takes every other week  4  . ONE TOUCH ULTRA TEST test strip USE TO CHECK SUAGR TWICE DAILY  99   No current facility-administered medications for this visit.    Allergies as of 11/26/2015 - Review Complete 11/26/2015  Allergen Reaction Noted  . Oxycodone Itching 08/12/2015  . Shellfish allergy Swelling 08/12/2015  . Cefaclor Rash 08/12/2015  . Penicillins Rash 08/12/2015    Vitals: BP 146/94 mmHg  Pulse 80  Ht 5' 2"  (1.575 m)  Wt 161 lb (73.029 kg)  BMI 29.44 kg/m2 Last Weight:  Wt Readings from Last 1 Encounters:  11/26/15 161 lb (73.029 kg)   Last Height:   Ht Readings from Last 1 Encounters:  11/26/15 5' 2"  (1.575 m)       Physical exam: Exam: Gen: NAD, conversant, well nourised, obese, well groomed  CV: RRR, no MRG. No Carotid Bruits. No peripheral edema, warm, nontender Eyes: Conjunctivae clear without exudates or hemorrhage  Neuro: Detailed Neurologic Exam  Speech:  Speech is normal; fluent and spontaneous with normal comprehension.  Cognition:  The patient is oriented to person, place, and time;   recent and remote memory intact;   language fluent;   normal attention,  concentration,   fund of knowledge Cranial Nerves:  The pupils are equal, round, and reactive to light. The fundi are normal and spontaneous venous pulsations are present. Visual fields are full to finger confrontation. Extraocular movements are intact. Trigeminal sensation is intact and the muscles of mastication are normal. The face is symmetric. The palate elevates in the midline. Hearing intact. Voice is normal. Shoulder shrug is normal. The tongue has normal motion without fasciculations.   Coordination:  Normal finger to nose and heel to shin. Normal rapid alternating movements.   Gait:  Heel-toe and tandem gait are normal.   Motor Observation:  No asymmetry, no atrophy, and no involuntary movements noted. Tone:  Normal muscle tone.   Posture:  Posture is  normal. normal erect   Strength:  Strength is V/V in the upper and lower limbs.    Sensation: intact to LT   Reflex Exam:  DTR's:  Deep tendon reflexes in the upper and lower extremities are normal bilaterally.  Toes:  The toes are downgoing bilaterally.  Clonus:  Clonus is absent.     Assessment/Plan: 50 year old patient with 10 year history of vertigo associated with migraines. She has been evaluated by multiple neurologists and the [facility] vestibular clinic. She's been diagnosed with vestibular migraines.  vestibular therapy: [facility] on [address] in [place]. She never heard from them will try again.  As far as your medications are concerned, I would like to suggest: Tried taking Xanax at the onset of next vestibular event as this is a very good vestibular suppressant. Depakote can increase to 560m.   Depakote: She is not planning on having any children, she is menopausal. Discussed teratogenicity of Depakote, do not get pregnant, use BC. Discussed side effects and provided an UpToDate patient drug information handout. Side effects of Depakote include  drowsiness, weakness, nausea, vomiting, stomach upset, diarrhea, constipation, mood swings, changes in menstrual periods, enlarged breasts, weight changes, agitation, tremor (shaking), vision changes, unusual or unpleasant taste in your mouth, and hair loss.  When she has vertigo, try and additional Xanax.     Addendum 10/01/2015: [facility] MRI of the head with and without contrast showed stable in normal cerebral volume, major intracranial vascular flow voids are stable and within normal limits, no restricted diffusion to suggest acute infarct, no midline shift, mass effect, no evidence of mass lesion, ventriculomegaly, extra-axial collection or acute intracranial hemorrhage. Cervical medullary junction and pituitary are within normal limits. Negative visualized cervical spine. Gray-white matter signal is within normal limits throughout the brain. No abnormal enhancement is identified. Visualized orbit soft tissues are within normal limits. Visualized para nasal sinuses and mastoids are clear. Visible internal auditory structures appear normal. Negative scalp soft tissue, bone marrow signal is normal. Pearson 09/10/2009. Stable in normal MRI appearance of the brain.  Same-day upper extremity artery unilateral: Unremarkable sonographic appearance of the bilateral subclavian arteries. Ordered for intermittent dizziness of 5 years worsened over the past month. She presented for bilateral duplex carotid ultrasonography was found to have an asymmetric midline bruit concerning for possible subclavian stenosis. Limited evaluation of the bilateral upper extremities, subclavian arteries, was performed in addition to the carotid duplex study. Bilateral carotid duplex ultrasound comparison 01/02/2010 no evidence for carotid artery stenosis and no significant plaque identified.  CC: Dr. PFonnie Birkenhead MD  [facility] [address] [address] Phone [phone] [phone] A total of 45 minutes was spent face-to-face with this patient. Over half this time was spent on counseling patient on the vestibular migraine diagnosis and different diagnostic and therapeutic options available.

## 2015-11-26 NOTE — Patient Instructions (Addendum)
Remember to drink plenty of fluid, eat healthy meals and do not skip any meals. Try to eat protein with a every meal and eat a healthy snack such as fruit or nuts in between meals. Try to keep a regular sleep-wake schedule and try to exercise daily, particularly in the form of walking, 20-30 minutes a day, if you can.   As far as your medications are concerned, I would like to suggest: Incease Depakote to 500mg  at night. Try Xanax for dizziness. Sleep eval.   I would like to see you back in 6 months, sooner if we need to. Please call us with any interim questions, concerns, problems, updates or refill requests.   Snoring, waking up with headache: Sleep eval in [place]  Our phone number is [phone]. We also have an after hours call service for urgent matters and there is a physician on-call for urgent questions. For any emergencies you know to call 911 or go to the nearest emergency room

## 2015-12-02 ENCOUNTER — Encounter: Payer: Self-pay | Admitting: Neurology

## 2015-12-03 ENCOUNTER — Telehealth: Payer: Self-pay | Admitting: Neurology

## 2015-12-03 ENCOUNTER — Other Ambulatory Visit: Payer: Self-pay | Admitting: Neurology

## 2015-12-03 DIAGNOSIS — R42 Dizziness and giddiness: Secondary | ICD-10-CM

## 2015-12-03 NOTE — Telephone Encounter (Signed)
Pt called said she is thinking about going to a different place than discussed with Dr Jaynee Eagles in [place]. She is wanting to know what vestibular exercises would be done so she could ask if they do them. This is her dentist who is also a headache specialist.

## 2015-12-03 NOTE — Telephone Encounter (Signed)
Chloe Pearson, please call patient to arrange vestibular therapy in [place]

## 2015-12-03 NOTE — Telephone Encounter (Signed)
Dr Jaynee Eagles- can you place referral for PT? I do not see in computer. I see referral for sleep eval in [place]. Thank you  Called and spoke to pt. Advised pt that typically PT does vestibular therapy. Advised she can call dental office to find out for sure. She declined and said she will go to original place that she talked to Dr Jaynee Eagles about. Advised it usually takes 1-2 weeks for them to call and schedule appt. Told her to call back if she does not hear about scheduling. She verbalized understanding.

## 2015-12-03 NOTE — Telephone Encounter (Signed)
Dr Ahern- please advise 

## 2015-12-03 NOTE — Telephone Encounter (Signed)
Chloe Pearson, have Hinton Dyer call patient and try to figure out her referrals please. Vestibular therapy does a lot of exercises, I don;t know exactly all the names. She should just ask if they perform "vestibular therapy" and they should know what she is talking about if they provide that service  thanks.

## 2015-12-04 ENCOUNTER — Other Ambulatory Visit: Payer: Self-pay | Admitting: *Deleted

## 2015-12-04 DIAGNOSIS — R42 Dizziness and giddiness: Secondary | ICD-10-CM

## 2015-12-04 DIAGNOSIS — G43109 Migraine with aura, not intractable, without status migrainosus: Secondary | ICD-10-CM

## 2015-12-04 DIAGNOSIS — G43809 Other migraine, not intractable, without status migrainosus: Secondary | ICD-10-CM

## 2015-12-05 NOTE — Telephone Encounter (Signed)
Referral has been sent to [facility] in [place] telephone [phone] - fax [phone]. I spoke to patient she is aware of details.  Vestibular rehab.

## 2015-12-10 ENCOUNTER — Encounter: Payer: Self-pay | Admitting: *Deleted

## 2015-12-10 NOTE — Progress Notes (Signed)
Faxed signed POC back to [facility] PT for dates of service 12/05/15-03/06/16. Fax: [phone]. Received fax confirmation.

## 2015-12-23 ENCOUNTER — Encounter: Payer: Self-pay | Admitting: Neurology

## 2016-01-01 ENCOUNTER — Encounter: Payer: Self-pay | Admitting: Neurology

## 2016-01-21 ENCOUNTER — Telehealth: Payer: Self-pay | Admitting: Neurology

## 2016-01-21 NOTE — Telephone Encounter (Signed)
Dr Jaynee Eagles- please advise. I did make a f/u in case you wanted to have her in for a follow-up.  Called pt back. She stated twitching is a new sx that she thinks she has mentioned before previously in a [employer]. She stated this started a couple months ago. Her vertigo is back to where she started when she first saw Dr Jaynee Eagles. At her last vestibular therapy session almost 2 weeks ago, she started spotting again. She called her PCP who advised that she has to start whole year over again.  She stated she was only 9 weeks away from being a year of not menstruating.  She is not sure if her sx are hormonal related. She states she is having more migraines. She is having 3-4 per week like before. She verified that she is taking her Depakote 2 tablets per night (500mg ) as prescribed. Advised I will speak with Dr Jaynee Eagles and call her back to advise. She verbalized understanding. She wants me to call her cell first and then call back at [phone]. Click 0.   I made f/u on 01/29/16 at 10am for f /u.

## 2016-01-21 NOTE — Telephone Encounter (Signed)
Pt called in with more twitching in left foot and hand. More like spasms. She says when she stands for even a few minute her right leg will start to shake some as well. More vertigo and she says she started spotting again, menstrual. Pt also had sleep study done. Pt will be going on vacation next week as well. Please call and advise [phone]

## 2016-01-21 NOTE — Telephone Encounter (Signed)
I would like to increase her depakote, please ask if this acceptable to her thanks

## 2016-01-22 NOTE — Telephone Encounter (Signed)
Called pt. She stated she was getting labs at PCP office right now. She will call back once she is finished.

## 2016-01-22 NOTE — Telephone Encounter (Signed)
Dr Jaynee Eagles- please advise  Called pt back. Relayed Dr Jaynee Eagles message. Pt agreeable to increase her Depakote. Advised I will call her back to let her know what Dr Jaynee Eagles wants to increase it to and how she should take it.   Pt wants to know if she should keep f/u for 8/16 or if that can be cx.

## 2016-01-22 NOTE — Telephone Encounter (Signed)
Patient returned Emma's call ° °

## 2016-01-23 ENCOUNTER — Other Ambulatory Visit: Payer: Self-pay | Admitting: Neurology

## 2016-01-23 MED ORDER — DIVALPROEX SODIUM ER 250 MG PO TB24: 750.0000 mg | ORAL_TABLET | Freq: Every day | ORAL | 12 refills | Status: DC

## 2016-01-23 NOTE — Telephone Encounter (Signed)
Increase to 750mg  at night. I ordered it thanks (please check my script please) thanks

## 2016-01-23 NOTE — Telephone Encounter (Signed)
Dr Jaynee Eagles- please advise  Called pt back. Relayed DR Jaynee Eagles message that she called in 750mg  for her to take at night. She has to take a total of 3 tablets. Advised her to call back if she has further questions or concerns, or if she has new/worsening sx.   She is wondering if the twitching can be a side effect from the medication. She had it all day yesterday and again today. Advised I will send message to Dr Jaynee Eagles.    Cx appt on 8/16 per pt request.

## 2016-01-23 NOTE — Telephone Encounter (Signed)
That would be a very uncommon side effect but is possible. If it continues we can discuss changing medication if it is severe or bothersome.

## 2016-01-24 NOTE — Telephone Encounter (Signed)
Spoke to pt and relayed that depakote causing twitching (both eyes, L foot second toe, L hand and R leg) uncommon SE, but possible.  If continues (is severe or bothersome) can consider changing med.   Pt verbalized understanding.  New Rx sent in 01-23-16 [facility] [place].

## 2016-01-29 ENCOUNTER — Ambulatory Visit: Payer: Self-pay | Admitting: Neurology

## 2016-03-19 ENCOUNTER — Encounter: Payer: Self-pay | Admitting: Neurology

## 2016-03-26 ENCOUNTER — Telehealth: Payer: Self-pay | Admitting: Neurology

## 2016-03-26 NOTE — Telephone Encounter (Signed)
I am happy to try this, I assume she means supraorbital nerve blocks if she wants to schedule them we can try and see if they help. thanks

## 2016-03-26 NOTE — Telephone Encounter (Signed)
Patient called to advise, Dr. Jillyn Ledger has recommended supraoptic nerve injections instead of BOTOX, if Dr. Jaynee Eagles thinks this is appropriate. Please call [phone].

## 2016-03-26 NOTE — Telephone Encounter (Signed)
Dr Ahern- please advise 

## 2016-03-27 NOTE — Telephone Encounter (Signed)
Called patient back. She stated she was speaking with Dr Laqueta Due and wants to try supraorbital nerve block instead of botox d/t botox lasting for 3 months and she has had bad reactions in the past to medications.  I offered to scheduled her on 10.27.17 at noon, but she declined stating she is going to keep f/u for 07/28/16 for right now. She will call back if she wants to come in sooner. She is going to complete MRI first and see what that shows.   She states Dr Laqueta Due should be sending over office note for Dr Jaynee Eagles. Advised I will relay message to Dr Jaynee Eagles.

## 2016-03-27 NOTE — Telephone Encounter (Signed)
Ok, if she has been scheduled for botox let daniel know so we can cancel.

## 2016-03-27 NOTE — Telephone Encounter (Signed)
Danielle- pt going to hold off on botox, FYI

## 2016-04-09 DIAGNOSIS — G43909 Migraine, unspecified, not intractable, without status migrainosus: Secondary | ICD-10-CM | POA: Diagnosis not present

## 2016-04-09 DIAGNOSIS — Z79899 Other long term (current) drug therapy: Secondary | ICD-10-CM | POA: Diagnosis not present

## 2016-04-09 DIAGNOSIS — G43109 Migraine with aura, not intractable, without status migrainosus: Secondary | ICD-10-CM | POA: Diagnosis not present

## 2016-04-15 DIAGNOSIS — G43109 Migraine with aura, not intractable, without status migrainosus: Secondary | ICD-10-CM | POA: Diagnosis not present

## 2016-04-15 DIAGNOSIS — M5134 Other intervertebral disc degeneration, thoracic region: Secondary | ICD-10-CM | POA: Diagnosis not present

## 2016-04-15 DIAGNOSIS — M545 Low back pain: Secondary | ICD-10-CM | POA: Diagnosis not present

## 2016-04-15 DIAGNOSIS — M62838 Other muscle spasm: Secondary | ICD-10-CM | POA: Diagnosis not present

## 2016-04-23 DIAGNOSIS — E785 Hyperlipidemia, unspecified: Secondary | ICD-10-CM | POA: Diagnosis not present

## 2016-04-23 DIAGNOSIS — E559 Vitamin D deficiency, unspecified: Secondary | ICD-10-CM | POA: Diagnosis not present

## 2016-04-23 DIAGNOSIS — E039 Hypothyroidism, unspecified: Secondary | ICD-10-CM | POA: Diagnosis not present

## 2016-04-23 DIAGNOSIS — Z79899 Other long term (current) drug therapy: Secondary | ICD-10-CM | POA: Diagnosis not present

## 2016-04-23 DIAGNOSIS — R7301 Impaired fasting glucose: Secondary | ICD-10-CM | POA: Diagnosis not present

## 2016-04-28 DIAGNOSIS — M545 Low back pain: Secondary | ICD-10-CM | POA: Diagnosis not present

## 2016-04-28 DIAGNOSIS — M546 Pain in thoracic spine: Secondary | ICD-10-CM | POA: Diagnosis not present

## 2016-04-28 DIAGNOSIS — M461 Sacroiliitis, not elsewhere classified: Secondary | ICD-10-CM | POA: Diagnosis not present

## 2016-04-28 DIAGNOSIS — M542 Cervicalgia: Secondary | ICD-10-CM | POA: Diagnosis not present

## 2016-04-30 DIAGNOSIS — Z1231 Encounter for screening mammogram for malignant neoplasm of breast: Secondary | ICD-10-CM | POA: Diagnosis not present

## 2016-04-30 DIAGNOSIS — Z0000 Encounter for general adult medical examination without abnormal findings: Secondary | ICD-10-CM | POA: Diagnosis not present

## 2016-05-12 DIAGNOSIS — M545 Low back pain: Secondary | ICD-10-CM | POA: Diagnosis not present

## 2016-05-12 DIAGNOSIS — M62838 Other muscle spasm: Secondary | ICD-10-CM | POA: Diagnosis not present

## 2016-05-12 DIAGNOSIS — M546 Pain in thoracic spine: Secondary | ICD-10-CM | POA: Diagnosis not present

## 2016-05-12 DIAGNOSIS — M542 Cervicalgia: Secondary | ICD-10-CM | POA: Diagnosis not present

## 2016-05-13 DIAGNOSIS — Z1231 Encounter for screening mammogram for malignant neoplasm of breast: Secondary | ICD-10-CM | POA: Diagnosis not present

## 2016-05-26 DIAGNOSIS — M530 Cervicocranial syndrome: Secondary | ICD-10-CM | POA: Diagnosis not present

## 2016-05-26 DIAGNOSIS — M542 Cervicalgia: Secondary | ICD-10-CM | POA: Diagnosis not present

## 2016-05-26 DIAGNOSIS — M546 Pain in thoracic spine: Secondary | ICD-10-CM | POA: Diagnosis not present

## 2016-05-26 DIAGNOSIS — M62838 Other muscle spasm: Secondary | ICD-10-CM | POA: Diagnosis not present

## 2016-05-27 DIAGNOSIS — R0602 Shortness of breath: Secondary | ICD-10-CM | POA: Diagnosis not present

## 2016-05-27 DIAGNOSIS — R05 Cough: Secondary | ICD-10-CM | POA: Diagnosis not present

## 2016-06-12 DIAGNOSIS — M62838 Other muscle spasm: Secondary | ICD-10-CM | POA: Diagnosis not present

## 2016-06-12 DIAGNOSIS — M542 Cervicalgia: Secondary | ICD-10-CM | POA: Diagnosis not present

## 2016-06-12 DIAGNOSIS — M530 Cervicocranial syndrome: Secondary | ICD-10-CM | POA: Diagnosis not present

## 2016-06-12 DIAGNOSIS — M546 Pain in thoracic spine: Secondary | ICD-10-CM | POA: Diagnosis not present

## 2016-06-25 DIAGNOSIS — G43109 Migraine with aura, not intractable, without status migrainosus: Secondary | ICD-10-CM | POA: Diagnosis not present

## 2016-06-25 DIAGNOSIS — M545 Low back pain: Secondary | ICD-10-CM | POA: Diagnosis not present

## 2016-06-25 DIAGNOSIS — M542 Cervicalgia: Secondary | ICD-10-CM | POA: Diagnosis not present

## 2016-06-25 DIAGNOSIS — M546 Pain in thoracic spine: Secondary | ICD-10-CM | POA: Diagnosis not present

## 2016-07-06 DIAGNOSIS — M25511 Pain in right shoulder: Secondary | ICD-10-CM | POA: Diagnosis not present

## 2016-07-06 DIAGNOSIS — G43109 Migraine with aura, not intractable, without status migrainosus: Secondary | ICD-10-CM | POA: Diagnosis not present

## 2016-07-06 DIAGNOSIS — M542 Cervicalgia: Secondary | ICD-10-CM | POA: Diagnosis not present

## 2016-07-06 DIAGNOSIS — M546 Pain in thoracic spine: Secondary | ICD-10-CM | POA: Diagnosis not present

## 2016-07-21 DIAGNOSIS — M542 Cervicalgia: Secondary | ICD-10-CM | POA: Diagnosis not present

## 2016-07-21 DIAGNOSIS — M545 Low back pain: Secondary | ICD-10-CM | POA: Diagnosis not present

## 2016-07-21 DIAGNOSIS — M62838 Other muscle spasm: Secondary | ICD-10-CM | POA: Diagnosis not present

## 2016-07-21 DIAGNOSIS — M546 Pain in thoracic spine: Secondary | ICD-10-CM | POA: Diagnosis not present

## 2016-07-28 ENCOUNTER — Encounter: Payer: Self-pay | Admitting: Neurology

## 2016-07-28 ENCOUNTER — Ambulatory Visit (INDEPENDENT_AMBULATORY_CARE_PROVIDER_SITE_OTHER): Payer: BLUE CROSS/BLUE SHIELD | Admitting: Neurology

## 2016-07-28 VITALS — BP 134/86 | HR 79 | Ht 62.0 in | Wt 158.0 lb

## 2016-07-28 DIAGNOSIS — G529 Cranial nerve disorder, unspecified: Secondary | ICD-10-CM | POA: Diagnosis not present

## 2016-07-28 DIAGNOSIS — G43809 Other migraine, not intractable, without status migrainosus: Secondary | ICD-10-CM

## 2016-07-28 DIAGNOSIS — G500 Trigeminal neuralgia: Secondary | ICD-10-CM

## 2016-07-28 DIAGNOSIS — G43109 Migraine with aura, not intractable, without status migrainosus: Secondary | ICD-10-CM

## 2016-07-28 MED ORDER — AMITRIPTYLINE HCL 10 MG PO TABS: 10.0000 mg | ORAL_TABLET | Freq: Every day | ORAL | 6 refills | Status: DC

## 2016-07-28 MED ORDER — DIVALPROEX SODIUM ER 250 MG PO TB24: 250.0000 mg | ORAL_TABLET | Freq: Every day | ORAL | 11 refills | Status: DC

## 2016-07-28 NOTE — Progress Notes (Addendum)
[facility]    Provider:  Dr Jaynee Eagles Referring Provider: Ernestene Kiel, MD Primary Care Physician:  Ernestene Kiel, MD  CC: Vertigo  Interval history 07/28/2016: Patient is here for follow up of vertigo and migraines. She has a long history of vestibular migraines and has seen multiple neurologists and been to [facility] vestibular clinic. MRIs have been unremarkable. We suggested botox for migraines. Can also try nerve blocks.  She had vestibular therapy which did not go well. Will perform a supraorbital nerve block today. Also discussed other migraine preventatives we can try, will start low-dose amitriptyline and can titrate as needed if no side effects. No Hx of cardiac problems. Should watch for worsening of dry mouth given her sjogren's, discussed all side effects as per patient AVS.  Medications tried: Depakote (on now), tried multiple triptans and intolerant, Topamax made her fingers hurt and could not try it, lexapro (did not tolerate). Takes meclizine and zofran and xanax for symptoms of vertigo/dizziness. Acutely she takes tylenol and meclizine or zofran. Scopolamine was too expensive and didn't try it. Amitriptyline.  Interval history 11/26/2015: 51 year old patient with 10 year history of vertigo associated with migraines. She has been evaluated by multiple neurologists and the [facility] vestibular clinic. She's been diagnosed with vestibular migraines.The Depakote has helped she doesn't get the roller coaster feeling. The Imitrex increased BP.  Since march she has had 5 a month on average with the vertigo. The scopolamine patch was too expensive. The Depakote seems to be helping a little bit we discussed vestibular migraines or basilar type migraines. Patient suffered from these for many years and she's been evaluated multiple times even up to do vestibular clinic. Discussed other options. She does have Xanax which is a very good vestibular suppressant, I recommend  trying that when she has the dizzy vertigo spells. Reglan is something else we can try. At this point we'll increase her Depakote and see if that helps with prevention.  Medications tried: Depakote (on now), tried multiple triptans and intolerant, Topamax made her fingers hurt and could not try it, lexapro (did not tolerate). Takes meclizine and zofran and xanax for symptoms of vertigo/dizziness. Acutely she takes tylenol and meclizine or zofran. Scopolamine was too expensive and didn't try it.    HPI: Patty Leitzke is a 51 y.o. female here as a referral from Dr. Laqueta Due for 10 years of vertigo. Past medical history hypertension, high cholesterol, migraine, anxiety, thyroid disease, migraine. She also has Sjogren's and Raynaud's syndrome and a history of epilepsy as a child. She has been to [facility] and to multiple neurologists. Getting worse as she gets closer to menopause. She was diagnosed with vestibular migraines with vertigo. Dizziness has occurred with migraines. The headaches are with photophobia and phonophobia. She feels like she is spinning. She feels like she is leaning to the right and isn't, like she is on a tilt-a-whirl. The vertigo will start slowly and progress over days then last all day long and all night and then slowly decrease over a week. This happenens over 2 weeks a month she will have varying amounts of vertigo. Worsening. Almost fell three times last Monday. No hearing changes. No food triggers or any triggers. Every once in a while she has migraines with the vertigo.She has had topamax and made her fingers tingle. OTC meclizine and zofran when it is really bad. She gets arm tingling in the fingers with the headaches. Nothing makes the symptoms better. No inciting events or head trauma. No other focal  neurologic deficits or symptoms. She has been evaluated by ENT without etiology for her vertigo, and she has had imaging of the brain which is normal.  Reviewed notes, labs and  imaging from outside physicians, which showed: Patient was seen by OB/GYN on fibular 27th 2017. She reported being on hormone therapy for about 4-5 years with ongoing menses. Tried different forms of hormone replacement therapy and even low doses of OCPs. Last menstrual period was April 2016. She is having hot flashes and night sweats. Declines any further hormonal treatment. She takes metformin for glucose intolerance and for her hot flashes. She was seen at [facility] at the vestibular clinic for her vertigo. She seen an ear nose and throat has normal hearing and no inner ear issues. She has had imaging of the brain which is normal. Normal pelvic ultrasound in March 2015. Fragmented pleura furtive endometrium. CT scan last February with scattered diverticula, renal stones and hiatal hernia type I. OB/GYN, Dr. Judeth Horn, discussed medications such as SSRIs and Effexor which were declined. She was offered pelvic ultrasound and declined.  TSH 4.080. Follows with primary care for thyroid management.  MRI of the brain from [facility], reviewed report. Stable and normal cerebral volume. Major intracranial vascular flow voids are stable within normal limits. No restricted diffusion. No midline shift, mass effect, evidence of mass lesion, ventriculomegaly, extra-axial collection or acute intracranial hemorrhage. Cervical medullary junction and pituitary are within normal limits. Negative visualized cervical spine. Gray-white matter signals within normal limits throughout the brain. No abnormal enhancement identified. Stable in normal MRI appearance of the brain. 03/14/2013.  Echocardiogram 03/14/2013. Normal left ventricular size and systolic function. Trace aortic and mitral insufficiency with no significant structural abnormalities identified. Mild TR, normal pulmonary artery pressure suggested. Aortic root and arch appear normal, no aneurysms or dissection, generally unremarkable echocardiographic  study.  Bilateral carotid duplex ultrasound performed at [facility] on 01/02/2010. Mild amount of plaque or intimal thickening in the carotid arteries. No significant carotid artery stenosis. Estimated degree of stenosis in the internal carotid arteries is 50-69% based on the peak systolic velocities which is likely an over estimation and probably less than 50%.  Labs from 07/24/2015: Normal CBC and normal CMP. Creatinine 0.85. CK 96. Hemoglobin A1c 6.2. LDL 121. Sedimentation rate 3. TSH 4.080. Vitamin D 25-hydroxy normal.  Review of Systems: Patient complains of symptoms per HPI as well as the following symptoms: Weight gain, fatigue, blurred vision, easy bruising, feeling hot, feeling cold, joint pain, diarrhea, constipation number ringing in ears, spinning sensation, itching, allergies, skin sensitivity, headache, numbness, dizziness, anxiety, decreased energy, sleepiness. Pertinent negatives per HPI. All others negative.  Social History   Social History  . Marital status: Single    Spouse name: N/A  . Number of children: 0  . Years of education: 66   Occupational History  . [employer]    Social History Main Topics  . Smoking status: Former Smoker    Types: Cigarettes    Quit date: 08/13/2001  . Smokeless tobacco: Never Used  . Alcohol use 0.0 oz/week     Comment: rare  . Drug use: No  . Sexual activity: No     Comment: condoms when needed   Other Topics Concern  . Not on file   Social History Narrative   Lives alone   Caffeine use: Coffee-1 cup per day (2 cups on Friday)   Right-handed    Family History  Problem Relation Age of Onset  . Osteoarthritis Mother   .  Diabetes Mother   . Hypertension Mother   . Hyperlipidemia Mother   . Diabetes Father   . Hyperlipidemia Father   . Hypertension Maternal Grandmother   . Hyperlipidemia Maternal Grandmother   . Stroke Maternal Grandmother   . Diabetes Maternal Grandfather   . Hyperlipidemia Maternal Grandfather    . Hyperlipidemia Paternal Grandmother   . Hyperlipidemia Paternal Grandfather   . Stroke Paternal Grandfather   . Cancer Other   . Cancer Cousin   . Diabetes Paternal Aunt   . Diabetes Paternal Uncle   . Migraines Neg Hx     Past Medical History:  Diagnosis Date  . Anxiety   . Epilepsy [facility])    age 30 to 60  . High cholesterol   . Hypertension   . Hypertension   . Migraine   . Raynaud's disease   . Sjogren's disease ([place])   . Thyroid disease    related to Sjogrens  . Vertigo     Past Surgical History:  Procedure Laterality Date  . CHOLECYSTECTOMY  about 2007    Current Outpatient Prescriptions  Medication Sig Dispense Refill  . acetaminophen (TYLENOL) 500 MG tablet Take 500 mg by mouth every 6 (six) hours as needed.    . ALPRAZolam (XANAX) 0.25 MG tablet Take 1 tablet by mouth 2 (two) times daily.    . Blood Glucose Monitoring Suppl (ONE TOUCH ULTRA 2) w/Device KIT USE TO CHECK SUGAR TWICE DAILY  0  . BYSTOLIC 5 MG tablet 1/2 tab. Twice daly    . divalproex (DEPAKOTE ER) 250 MG 24 hr tablet Take 3 tablets (750 mg total) by mouth daily. (Patient taking differently: Take 250 mg by mouth daily. ) 90 tablet 12  . furosemide (LASIX) 20 MG tablet Take 1 tablet by mouth as needed.    . hydroxychloroquine (PLAQUENIL) 200 MG tablet Take 1 tablet by mouth daily.    Marland Kitchen levothyroxine (SYNTHROID, LEVOTHROID) 75 MCG tablet Take 75 mcg by mouth daily.  4  . metFORMIN (GLUCOPHAGE-XR) 500 MG 24 hr tablet Take 1 tablet by mouth daily with breakfast.   5  . ondansetron (ZOFRAN-ODT) 4 MG disintegrating tablet Take 1 tablet by mouth as needed.    . ONE TOUCH ULTRA TEST test strip USE TO CHECK SUAGR TWICE DAILY  99  . pravastatin (PRAVACHOL) 20 MG tablet Take 20 mg by mouth daily.  3  . Vitamin D, Ergocalciferol, (DRISDOL) 50000 units CAPS capsule Take 50,000 Units by mouth once a week. Takes every other week  4   No current facility-administered medications for this visit.     Allergies  as of 07/28/2016 - Review Complete 07/28/2016  Allergen Reaction Noted  . Oxycodone Itching 08/12/2015  . Shellfish allergy Swelling 08/12/2015  . Cefaclor Rash 08/12/2015  . Penicillins Rash 08/12/2015    Vitals: BP 134/86   Pulse 79   Ht 5' 2"  (1.575 m)   Wt 158 lb (71.7 kg)   BMI 28.90 kg/m  Last Weight:  Wt Readings from Last 1 Encounters:  07/28/16 158 lb (71.7 kg)   Last Height:   Ht Readings from Last 1 Encounters:  07/28/16 5' 2"  (1.575 m)     Assessment/Plan: 51 year old patient with 10 year history of vertigo associated with migraines. She has been evaluated by multiple neurologists and the [facility] vestibular clinic. She's been diagnosed with vestibular migraines.  vestibular therapy: Did not help Did not tolerate increase in Depakote Will try low-dose Amitriptyline  Performed by Dr. Ephraim Hamburger.D.  30-gauge needle was used. All procedures as documented below were medically necessary, reasonable and appropriate based on the patient's history, medical diagnosis and physician opinion. Verbal informed consent was obtained from the patient, patient was informed of potential risk of procedure, including bruising, bleeding, hematoma formation, infection, muscle weakness, muscle pain, numbness, transient hypertension, transient hyperglycemia and transient insomnia among others. All areas injected were topically clean with isopropyl rubbing alcohol. Nonsterile nonlatex gloves were worn during the procedure.  4. Supraorbital nerve block (64400): Supraorbital nerve site was identified along the incision of the frontal bone on the orbital/supraorbital ridge. Medication was injected into the left supraorbital nerve areas. Patient's condition is associated with inflammation of the supraorbital and associated muscle groups. Injection was deemed medically necessary, reasonable and appropriate. Injection represents a separate and unique surgical service.   Addendum 10/01/2015: Reviewed MRI  reports from [facility] radiology dated 03/14/2013.: MRI of the head with and without contrast showed stable in normal cerebral volume, major intracranial vascular flow voids are stable and within normal limits, no restricted diffusion to suggest acute infarct, no midline shift, mass effect, no evidence of mass lesion, ventriculomegaly, extra-axial collection or acute intracranial hemorrhage. Cervical medullary junction and pituitary are within normal limits. Negative visualized cervical spine. Gray-white matter signal is within normal limits throughout the brain. No abnormal enhancement is identified. Visualized orbit soft tissues are within normal limits. Visualized para nasal sinuses and mastoids are clear. Visible internal auditory structures appear normal. Negative scalp soft tissue, bone marrow signal is normal. Pearson 09/10/2009. Stable in normal MRI appearance of the brain.  Same-day upper extremity artery unilateral: Unremarkable sonographic appearance of the bilateral subclavian arteries. Ordered for intermittent dizziness of 5 years worsened over the past month. She presented for bilateral duplex carotid ultrasonography was found to have an asymmetric midline bruit concerning for possible subclavian stenosis. Limited evaluation of the bilateral upper extremities, subclavian arteries, was performed in addition to the carotid duplex study. Bilateral carotid duplex ultrasound comparison 01/02/2010 no evidence for carotid artery stenosis and no significant plaque identified.  CC: Dr. Fonnie Birkenhead, MD  [facility] [address] [place], [place] [address]  Phone [phone] Fax [phone]  A total of 25 minutes was spent in with this patient face-to-face. Over half this time was spent on counseling patient on the vestibular migraine and supraorbital diagnosis and different therapeutic options available. This time is not inclusive to the nerve  block which was additional.

## 2016-07-28 NOTE — Progress Notes (Signed)
Bupivacaine 0.5% (5mg /ml) - 1.5 ml NDC [id number]  BATCH: [id number] EXPIRY: 11/2017  Xylocaine 1% (10 mg/ml) - 1.5 ml NDC [id number] LOT [id number] EXP 05/19  Mixed 1:1 using aseptic technique in 3 ml syringe

## 2016-07-28 NOTE — Patient Instructions (Addendum)
Remember to drink plenty of fluid, eat healthy meals and do not skip any meals. Try to eat protein with a every meal and eat a healthy snack such as fruit or nuts in between meals. Try to keep a regular sleep-wake schedule and try to exercise daily, particularly in the form of walking, 20-30 minutes a day, if you can.   As far as your medications are concerned, I would like to suggest: We can try Amitriptyline at bedtime  I would like to see you back in 6 months, sooner if we need to. Please call us with any interim questions, concerns, problems, updates or refill requests.   Our phone number is [phone]. We also have an after hours call service for urgent matters and there is a physician on-call for urgent questions. For any emergencies you know to call 911 or go to the nearest emergency room  Amitriptyline tablets  What should I tell my health care provider before I take this medicine? They need to know if you have any of these conditions: -an alcohol problem -asthma, difficulty breathing -bipolar disorder or schizophrenia -difficulty passing urine, prostate trouble -glaucoma -heart disease or previous heart attack -liver disease -over active thyroid -seizures -thoughts or plans of suicide, a previous suicide attempt, or family history of suicide attempt -an unusual or allergic reaction to amitriptyline, other medicines, foods, dyes, or preservatives -pregnant or trying to get pregnant -breast-feeding How should I use this medicine? Take this medicine by mouth with a drink of water. Follow the directions on the prescription label. You can take the tablets with or without food. Take your medicine at regular intervals. Do not take it more often than directed. Do not stop taking this medicine suddenly except upon the advice of your doctor. Stopping this medicine too quickly may cause serious side effects or your condition may worsen. A special MedGuide will be given to you by the  pharmacist with each prescription and refill. Be sure to read this information carefully each time. Talk to your pediatrician regarding the use of this medicine in children. Special care may be needed. Overdosage: If you think you have taken too much of this medicine contact a poison control center or emergency room at once. NOTE: This medicine is only for you. Do not share this medicine with others. What if I miss a dose? If you miss a dose, take it as soon as you can. If it is almost time for your next dose, take only that dose. Do not take double or extra doses. What may interact with this medicine? Do not take this medicine with any of the following medications: -arsenic trioxide -certain medicines used to regulate abnormal heartbeat or to treat other heart conditions -cisapride -droperidol -halofantrine -linezolid -MAOIs like Carbex, Eldepryl, Marplan, Nardil, and Parnate -methylene blue -other medicines for mental depression -phenothiazines like perphenazine, thioridazine and chlorpromazine -pimozide -probucol -procarbazine -sparfloxacin -St. John's Wort -ziprasidone This medicine may also interact with the following medications: -atropine and related drugs like hyoscyamine, scopolamine, tolterodine and others -barbiturate medicines for inducing sleep or treating seizures, like phenobarbital -cimetidine -disulfiram -ethchlorvynol -thyroid hormones such as levothyroxine This list may not describe all possible interactions. Give your health care provider a list of all the medicines, herbs, non-prescription drugs, or dietary supplements you use. Also tell them if you smoke, drink alcohol, or use illegal drugs. Some items may interact with your medicine. What should I watch for while using this medicine? Tell your doctor if your symptoms do not  get better or if they get worse. Visit your doctor or health care professional for regular checks on your progress. Because it may take  several weeks to see the full effects of this medicine, it is important to continue your treatment as prescribed by your doctor. Patients and their families should watch out for new or worsening thoughts of suicide or depression. Also watch out for sudden changes in feelings such as feeling anxious, agitated, panicky, irritable, hostile, aggressive, impulsive, severely restless, overly excited and hyperactive, or not being able to sleep. If this happens, especially at the beginning of treatment or after a change in dose, call your health care professional. Dennis Bast may get drowsy or dizzy. Do not drive, use machinery, or do anything that needs mental alertness until you know how this medicine affects you. Do not stand or sit up quickly, especially if you are an older patient. This reduces the risk of dizzy or fainting spells. Alcohol may interfere with the effect of this medicine. Avoid alcoholic drinks. Do not treat yourself for coughs, colds, or allergies without asking your doctor or health care professional for advice. Some ingredients can increase possible side effects. Your mouth may get dry. Chewing sugarless gum or sucking hard candy, and drinking plenty of water will help. Contact your doctor if the problem does not go away or is severe. This medicine may cause dry eyes and blurred vision. If you wear contact lenses you may feel some discomfort. Lubricating drops may help. See your eye doctor if the problem does not go away or is severe. This medicine can cause constipation. Try to have a bowel movement at least every 2 to 3 days. If you do not have a bowel movement for 3 days, call your doctor or health care professional. This medicine can make you more sensitive to the sun. Keep out of the sun. If you cannot avoid being in the sun, wear protective clothing and use sunscreen. Do not use sun lamps or tanning beds/booths. What side effects may I notice from receiving this medicine? Side effects that you  should report to your doctor or health care professional as soon as possible: -allergic reactions like skin rash, itching or hives, swelling of the face, lips, or tongue -anxious -breathing problems -changes in vision -confusion -elevated mood, decreased need for sleep, racing thoughts, impulsive behavior -eye pain -fast, irregular heartbeat -feeling faint or lightheaded, falls -feeling agitated, angry, or irritable -fever with increased sweating -hallucination, loss of contact with reality -seizures -stiff muscles -suicidal thoughts or other mood changes -tingling, pain, or numbness in the feet or hands -trouble passing urine or change in the amount of urine -trouble sleeping -unusually weak or tired -vomiting -yellowing of the eyes or skin Side effects that usually do not require medical attention (report to your doctor or health care professional if they continue or are bothersome): -change in sex drive or performance -change in appetite or weight -constipation -dizziness -dry mouth -nausea -tired -tremors -upset stomach This list may not describe all possible side effects. Call your doctor for medical advice about side effects. You may report side effects to FDA at 1-800-FDA-1088. Where should I keep my medicine? Keep out of the reach of children. Store at room temperature between 20 and 25 degrees C (68 and 77 degrees F). Throw away any unused medicine after the expiration date. NOTE: This sheet is a summary. It may not cover all possible information. If you have questions about this medicine, talk to your doctor, pharmacist,  or health care provider.  2017 Elsevier/Gold Standard (2015-11-01 12:14:15)

## 2016-08-05 DIAGNOSIS — M542 Cervicalgia: Secondary | ICD-10-CM | POA: Diagnosis not present

## 2016-08-05 DIAGNOSIS — M62838 Other muscle spasm: Secondary | ICD-10-CM | POA: Diagnosis not present

## 2016-08-05 DIAGNOSIS — M546 Pain in thoracic spine: Secondary | ICD-10-CM | POA: Diagnosis not present

## 2016-08-05 DIAGNOSIS — M545 Low back pain: Secondary | ICD-10-CM | POA: Diagnosis not present

## 2016-08-11 ENCOUNTER — Encounter: Payer: Self-pay | Admitting: Neurology

## 2016-08-17 ENCOUNTER — Telehealth: Payer: Self-pay | Admitting: Neurology

## 2016-08-17 NOTE — Telephone Encounter (Signed)
Pt called wanting to know if Dr A will approve for her to have car windows tinted at 20% due to migraines and sjogrens disease. She has a form she can drop it by. Please call

## 2016-08-17 NOTE — Telephone Encounter (Signed)
Patient called office in reference to scheduling a nerve block for both left and right sides.  Please call

## 2016-08-17 NOTE — Telephone Encounter (Signed)
Sure. thanks 

## 2016-08-18 NOTE — Telephone Encounter (Signed)
You can squeeze her in at noon or 430 before then anytime, these are quick thanks!

## 2016-08-18 NOTE — Telephone Encounter (Signed)
Patient called office in reference to scheduling nerve block.  Please call

## 2016-08-18 NOTE — Telephone Encounter (Signed)
Returned call and let pt know that Dr. Jaynee Eagles is agreeable to completing form for window tinting.

## 2016-08-18 NOTE — Telephone Encounter (Signed)
Left VM mssg for pt to call back and schedule injections any day at noon or 4:30.

## 2016-08-19 DIAGNOSIS — M25552 Pain in left hip: Secondary | ICD-10-CM | POA: Diagnosis not present

## 2016-08-19 DIAGNOSIS — M461 Sacroiliitis, not elsewhere classified: Secondary | ICD-10-CM | POA: Diagnosis not present

## 2016-08-19 DIAGNOSIS — G44209 Tension-type headache, unspecified, not intractable: Secondary | ICD-10-CM | POA: Diagnosis not present

## 2016-08-19 DIAGNOSIS — R42 Dizziness and giddiness: Secondary | ICD-10-CM | POA: Diagnosis not present

## 2016-08-19 NOTE — Telephone Encounter (Signed)
Pt called back. Appt scheduled for Fr 3/9 @ noon.

## 2016-08-20 ENCOUNTER — Encounter: Payer: Self-pay | Admitting: Neurology

## 2016-08-20 NOTE — Telephone Encounter (Signed)
Patient calling to reschedule her nerve block for tomorrow. She is having car problems and will not be able to come. I already cancelled the appointment.

## 2016-08-20 NOTE — Telephone Encounter (Signed)
Responded to pt via MyChart

## 2016-08-21 ENCOUNTER — Ambulatory Visit (INDEPENDENT_AMBULATORY_CARE_PROVIDER_SITE_OTHER): Payer: BLUE CROSS/BLUE SHIELD | Admitting: Neurology

## 2016-08-21 ENCOUNTER — Ambulatory Visit: Payer: Self-pay | Admitting: Neurology

## 2016-08-21 DIAGNOSIS — Z79899 Other long term (current) drug therapy: Secondary | ICD-10-CM | POA: Diagnosis not present

## 2016-08-21 DIAGNOSIS — E039 Hypothyroidism, unspecified: Secondary | ICD-10-CM | POA: Diagnosis not present

## 2016-08-21 DIAGNOSIS — E785 Hyperlipidemia, unspecified: Secondary | ICD-10-CM | POA: Diagnosis not present

## 2016-08-21 DIAGNOSIS — G500 Trigeminal neuralgia: Secondary | ICD-10-CM | POA: Diagnosis not present

## 2016-08-21 DIAGNOSIS — R7301 Impaired fasting glucose: Secondary | ICD-10-CM | POA: Diagnosis not present

## 2016-08-21 DIAGNOSIS — E559 Vitamin D deficiency, unspecified: Secondary | ICD-10-CM | POA: Diagnosis not present

## 2016-08-21 NOTE — Progress Notes (Signed)
Bupivacaine 0.5% (5mg /ml) NDC 92446-286-38 BATCH: [id number] EXPIRY: 11/2017  Xylocaine 2% (20 mg/ml) NDC 03833-383-29 LOT [id number] EXP 07/2019

## 2016-08-22 NOTE — Progress Notes (Signed)
Performed by Dr. Jaynee Eagles M.D.  All procedures a documented were medically necessary, reasonable and appropriate based on the patient's history, medical diagnosis and physician opinion. Verbal informed consent was obtained from the patient, patient was informed of potential risk of procedure, including bruising, bleeding, hematoma formation, infection, muscle weakness, muscle pain, numbness, transient hypertension, transient hyperglycemia and transient insomnia among others. All areas injected were topically clean with isopropyl rubbing alcohol. Nonsterile nonlatex gloves were worn during the procedure.   Supraorbital nerve block (64400): Supraorbital and Upratrochlear nerve sites were  was identified along the incision of the frontal bone on the orbital/supraorbital ridge and trochlear. Medication was injected into the left and right supraorbital and trochlear nerve areas. Patient's condition is associated with inflammation of the supraorbital and associated muscle groups. Injection was deemed medically necessary, reasonable and appropriate. Injection represents a separate and unique surgical service.

## 2016-08-28 DIAGNOSIS — E559 Vitamin D deficiency, unspecified: Secondary | ICD-10-CM | POA: Diagnosis not present

## 2016-08-28 DIAGNOSIS — I10 Essential (primary) hypertension: Secondary | ICD-10-CM | POA: Diagnosis not present

## 2016-08-28 DIAGNOSIS — G43109 Migraine with aura, not intractable, without status migrainosus: Secondary | ICD-10-CM | POA: Diagnosis not present

## 2016-08-28 DIAGNOSIS — E785 Hyperlipidemia, unspecified: Secondary | ICD-10-CM | POA: Diagnosis not present

## 2016-08-31 DIAGNOSIS — M25552 Pain in left hip: Secondary | ICD-10-CM | POA: Diagnosis not present

## 2016-08-31 DIAGNOSIS — G44209 Tension-type headache, unspecified, not intractable: Secondary | ICD-10-CM | POA: Diagnosis not present

## 2016-08-31 DIAGNOSIS — M461 Sacroiliitis, not elsewhere classified: Secondary | ICD-10-CM | POA: Diagnosis not present

## 2016-08-31 DIAGNOSIS — R42 Dizziness and giddiness: Secondary | ICD-10-CM | POA: Diagnosis not present

## 2016-09-07 ENCOUNTER — Encounter: Payer: Self-pay | Admitting: Neurology

## 2016-09-07 ENCOUNTER — Telehealth: Payer: Self-pay | Admitting: Neurology

## 2016-09-07 NOTE — Telephone Encounter (Signed)
Delsa Sale, can you write a letter please? Thanks!   I'm so sorry to bother you with this, I would need for you to write a letter stating I can have 5% window tint on my windshield / visor area.   I got my letter and permit stating I can have all the windows tinted to 20% but the windshield / visor area can not be any darker than 70% which it already is as came from [place]. I told the DMV that's where I get blinded the most between rear view mirror and visor and have to put a hand up which blocks view and drive with only 1 hand. I thought it could be any % for the visor area. They said no I need a letter from my Dr. stating it's ok to be at 5% for medical reason.  I'm getting my windows done this week and having my visor area done at 5% and taking a chance but that's where I need it the most. If you could do the letter it would be greatly appreciated and you could mail to my house and I'll send in. Again, sorry to bother you with this. Please let me know either way if you'd be willing to do or not.   Thank you!!

## 2016-09-07 NOTE — Telephone Encounter (Signed)
:  Letter completed, awaiting MD review/signature.

## 2016-09-08 NOTE — Telephone Encounter (Signed)
Letter signed and placed in mail to pt.

## 2016-09-15 DIAGNOSIS — M545 Low back pain: Secondary | ICD-10-CM | POA: Diagnosis not present

## 2016-09-15 DIAGNOSIS — M546 Pain in thoracic spine: Secondary | ICD-10-CM | POA: Diagnosis not present

## 2016-09-15 DIAGNOSIS — M542 Cervicalgia: Secondary | ICD-10-CM | POA: Diagnosis not present

## 2016-09-15 DIAGNOSIS — G44209 Tension-type headache, unspecified, not intractable: Secondary | ICD-10-CM | POA: Diagnosis not present

## 2016-09-22 NOTE — Telephone Encounter (Signed)
Marcie in Medical Review from [facility] is asking for a call to discuss the request for tinting, she said your call may go through their call center but to just ask for [facility] in Medical Review [phone]

## 2016-09-23 NOTE — Telephone Encounter (Signed)
Returned call to [facility] at [facility], answered questions about additional 5% tinting to the visor area (already at the standard 70%). Agreed to discuss w/ her supervisor d/t the max standard tint being 70%.

## 2016-09-30 DIAGNOSIS — M25572 Pain in left ankle and joints of left foot: Secondary | ICD-10-CM | POA: Diagnosis not present

## 2016-09-30 DIAGNOSIS — M79672 Pain in left foot: Secondary | ICD-10-CM | POA: Diagnosis not present

## 2016-10-01 DIAGNOSIS — M545 Low back pain: Secondary | ICD-10-CM | POA: Diagnosis not present

## 2016-10-01 DIAGNOSIS — M546 Pain in thoracic spine: Secondary | ICD-10-CM | POA: Diagnosis not present

## 2016-10-01 DIAGNOSIS — G44209 Tension-type headache, unspecified, not intractable: Secondary | ICD-10-CM | POA: Diagnosis not present

## 2016-10-01 DIAGNOSIS — M542 Cervicalgia: Secondary | ICD-10-CM | POA: Diagnosis not present

## 2016-10-12 DIAGNOSIS — S93402A Sprain of unspecified ligament of left ankle, initial encounter: Secondary | ICD-10-CM | POA: Diagnosis not present

## 2016-10-15 DIAGNOSIS — M545 Low back pain: Secondary | ICD-10-CM | POA: Diagnosis not present

## 2016-10-15 DIAGNOSIS — M542 Cervicalgia: Secondary | ICD-10-CM | POA: Diagnosis not present

## 2016-10-15 DIAGNOSIS — G44209 Tension-type headache, unspecified, not intractable: Secondary | ICD-10-CM | POA: Diagnosis not present

## 2016-10-15 DIAGNOSIS — M546 Pain in thoracic spine: Secondary | ICD-10-CM | POA: Diagnosis not present

## 2016-10-19 ENCOUNTER — Other Ambulatory Visit: Payer: Self-pay

## 2016-10-19 DIAGNOSIS — S93492A Sprain of other ligament of left ankle, initial encounter: Secondary | ICD-10-CM | POA: Diagnosis not present

## 2016-10-19 MED ORDER — AMITRIPTYLINE HCL 10 MG PO TABS
10.0000 mg | ORAL_TABLET | Freq: Every day | ORAL | 3 refills | Status: DC
Start: 2016-10-19 — End: 2017-02-08

## 2016-10-19 NOTE — Progress Notes (Signed)
E-scribed 90 day refills per faxed request from pharmacy.

## 2016-10-22 ENCOUNTER — Encounter: Payer: Self-pay | Admitting: Neurology

## 2016-10-26 ENCOUNTER — Encounter: Payer: Self-pay | Admitting: Neurology

## 2016-10-26 ENCOUNTER — Ambulatory Visit (INDEPENDENT_AMBULATORY_CARE_PROVIDER_SITE_OTHER): Payer: BLUE CROSS/BLUE SHIELD | Admitting: Neurology

## 2016-10-26 ENCOUNTER — Telehealth: Payer: Self-pay

## 2016-10-26 DIAGNOSIS — G529 Cranial nerve disorder, unspecified: Secondary | ICD-10-CM

## 2016-10-26 DIAGNOSIS — G500 Trigeminal neuralgia: Secondary | ICD-10-CM

## 2016-10-26 DIAGNOSIS — M461 Sacroiliitis, not elsewhere classified: Secondary | ICD-10-CM | POA: Diagnosis not present

## 2016-10-26 DIAGNOSIS — M62838 Other muscle spasm: Secondary | ICD-10-CM | POA: Diagnosis not present

## 2016-10-26 DIAGNOSIS — M546 Pain in thoracic spine: Secondary | ICD-10-CM | POA: Diagnosis not present

## 2016-10-26 DIAGNOSIS — M542 Cervicalgia: Secondary | ICD-10-CM | POA: Diagnosis not present

## 2016-10-26 NOTE — Progress Notes (Signed)
Performed by Dr. Jaynee Eagles M.D. 3 ml Lidocaine in a 30-gauge needle was used. All procedures a documented blood were medically necessary, reasonable and appropriate based on the patient's history, medical diagnosis and physician opinion. Verbal informed consent was obtained from the patient, patient was informed of potential risk of procedure, including bruising, bleeding, hematoma formation, infection, muscle weakness, muscle pain, numbness, transient hypertension, transient hyperglycemia and transient insomnia among others. All areas injected were topically clean with isopropyl rubbing alcohol. Nonsterile nonlatex gloves were worn during the procedure.  1. Supraorbital nerve block (64400): Supraorbital nerve site was identified along the incision of the frontal bone on the orbital/supraorbital ridge. Medication was injected into the left and right supraorbital nerve areas. Patient's condition is associated with inflammation of the supraorbital and associated muscle groups. Injection was deemed medically necessary, reasonable and appropriate. Injection represents a separate and unique surgical service.

## 2016-10-26 NOTE — Telephone Encounter (Signed)
Called and spoke to pharmacist Quita Skye) at [facility]. Gave verbal order for 30 grams x 5 refills of compounded topical numbing cream 23% lidocaine and xylocaine. Pharmacy will contact pt when ready.

## 2016-10-27 ENCOUNTER — Encounter: Payer: Self-pay | Admitting: Neurology

## 2016-11-20 DIAGNOSIS — E559 Vitamin D deficiency, unspecified: Secondary | ICD-10-CM | POA: Diagnosis not present

## 2016-11-20 DIAGNOSIS — I10 Essential (primary) hypertension: Secondary | ICD-10-CM | POA: Diagnosis not present

## 2016-11-20 DIAGNOSIS — E785 Hyperlipidemia, unspecified: Secondary | ICD-10-CM | POA: Diagnosis not present

## 2016-11-20 DIAGNOSIS — R7301 Impaired fasting glucose: Secondary | ICD-10-CM | POA: Diagnosis not present

## 2016-11-20 DIAGNOSIS — E039 Hypothyroidism, unspecified: Secondary | ICD-10-CM | POA: Diagnosis not present

## 2016-11-20 DIAGNOSIS — Z79899 Other long term (current) drug therapy: Secondary | ICD-10-CM | POA: Diagnosis not present

## 2016-11-24 DIAGNOSIS — M545 Low back pain: Secondary | ICD-10-CM | POA: Diagnosis not present

## 2016-11-24 DIAGNOSIS — M542 Cervicalgia: Secondary | ICD-10-CM | POA: Diagnosis not present

## 2016-11-24 DIAGNOSIS — G43109 Migraine with aura, not intractable, without status migrainosus: Secondary | ICD-10-CM | POA: Diagnosis not present

## 2016-11-24 DIAGNOSIS — M546 Pain in thoracic spine: Secondary | ICD-10-CM | POA: Diagnosis not present

## 2016-11-26 ENCOUNTER — Encounter: Payer: Self-pay | Admitting: Neurology

## 2016-11-26 MED ORDER — DIVALPROEX SODIUM ER 250 MG PO TB24: 250.0000 mg | ORAL_TABLET | Freq: Every day | ORAL | 11 refills | Status: DC

## 2016-11-27 DIAGNOSIS — I10 Essential (primary) hypertension: Secondary | ICD-10-CM | POA: Diagnosis not present

## 2016-11-27 DIAGNOSIS — N390 Urinary tract infection, site not specified: Secondary | ICD-10-CM | POA: Diagnosis not present

## 2016-11-27 DIAGNOSIS — E785 Hyperlipidemia, unspecified: Secondary | ICD-10-CM | POA: Diagnosis not present

## 2016-11-27 DIAGNOSIS — R300 Dysuria: Secondary | ICD-10-CM | POA: Diagnosis not present

## 2016-11-30 DIAGNOSIS — S93492A Sprain of other ligament of left ankle, initial encounter: Secondary | ICD-10-CM | POA: Diagnosis not present

## 2016-12-07 ENCOUNTER — Encounter: Payer: Self-pay | Admitting: Neurology

## 2016-12-07 ENCOUNTER — Ambulatory Visit (INDEPENDENT_AMBULATORY_CARE_PROVIDER_SITE_OTHER): Payer: Self-pay | Admitting: Neurology

## 2016-12-07 VITALS — BP 151/84 | HR 78 | Ht 62.0 in | Wt 157.8 lb

## 2016-12-07 DIAGNOSIS — G529 Cranial nerve disorder, unspecified: Secondary | ICD-10-CM

## 2016-12-07 DIAGNOSIS — G500 Trigeminal neuralgia: Secondary | ICD-10-CM

## 2016-12-07 DIAGNOSIS — S134XXA Sprain of ligaments of cervical spine, initial encounter: Secondary | ICD-10-CM | POA: Diagnosis not present

## 2016-12-07 DIAGNOSIS — M62838 Other muscle spasm: Secondary | ICD-10-CM | POA: Diagnosis not present

## 2016-12-07 DIAGNOSIS — G43109 Migraine with aura, not intractable, without status migrainosus: Secondary | ICD-10-CM | POA: Diagnosis not present

## 2016-12-07 DIAGNOSIS — M546 Pain in thoracic spine: Secondary | ICD-10-CM | POA: Diagnosis not present

## 2016-12-07 NOTE — Progress Notes (Signed)
Performed by Dr. Jaynee Eagles M.D. All procedures a documented blood were medically necessary, reasonable and appropriate based on the patient's history, medical diagnosis and physician opinion. Verbal informed consent was obtained from the patient, patient was informed of potential risk of procedure, including bruising, bleeding, hematoma formation, infection, muscle weakness, muscle pain, numbness, transient hypertension, transient hyperglycemia and transient insomnia among others. All areas injected were topically clean with isopropyl rubbing alcohol. Nonsterile nonlatex gloves were worn during the procedure.  1. Auriculotemporal nerve block (22633): The Auriculotemporal nerve site was identified along the posterior margin of the sternocleidomastoid muscle toward the base of the ear. Medication was injected into the left and right radicular temporal nerve areas. Patient's condition is associated with inflammation of the Auriculotemporal Nerve and associated muscle groups. Injection was deemed medically necessary, reasonable and appropriate. Injection represents a separate and unique surgical service.  2. Supraorbital nerve block (64400): Supraorbital nerve site was identified along the incision of the frontal bone on the orbital/supraorbital ridge. Medication was injected into the left and right supraorbital nerve areas. Patient's condition is associated with inflammation of the supraorbital and associated muscle groups. Injection was deemed medically necessary, reasonable and appropriate. Injection represents a separate and unique surgical service.   No Charge for these injections, patient's last injections were with lidocaine only because of national shortage of marcaine and they did not work I have re-injected her at no charge.

## 2016-12-07 NOTE — Progress Notes (Signed)
Nerve block:  Xylocaine 2% (20 mg/ml) NDC 44818-563-14 Lot [id number] Exp 02/21  Bupivacaine 0.25% NDC 85885-027-74 Lot [id number] Exp: 03/22  One 3 ml syringe w/ 1.5 ml marcaine and 1.5 ml lidocaine

## 2016-12-08 ENCOUNTER — Encounter: Payer: Self-pay | Admitting: Neurology

## 2016-12-29 DIAGNOSIS — M546 Pain in thoracic spine: Secondary | ICD-10-CM | POA: Diagnosis not present

## 2016-12-29 DIAGNOSIS — M542 Cervicalgia: Secondary | ICD-10-CM | POA: Diagnosis not present

## 2016-12-29 DIAGNOSIS — M461 Sacroiliitis, not elsewhere classified: Secondary | ICD-10-CM | POA: Diagnosis not present

## 2016-12-29 DIAGNOSIS — M62838 Other muscle spasm: Secondary | ICD-10-CM | POA: Diagnosis not present

## 2017-01-11 DIAGNOSIS — M546 Pain in thoracic spine: Secondary | ICD-10-CM | POA: Diagnosis not present

## 2017-01-11 DIAGNOSIS — G43109 Migraine with aura, not intractable, without status migrainosus: Secondary | ICD-10-CM | POA: Diagnosis not present

## 2017-01-11 DIAGNOSIS — M542 Cervicalgia: Secondary | ICD-10-CM | POA: Diagnosis not present

## 2017-01-11 DIAGNOSIS — M545 Low back pain: Secondary | ICD-10-CM | POA: Diagnosis not present

## 2017-01-20 NOTE — Telephone Encounter (Signed)
Chloe Pearson with DMV is calling about letter submitted for window tinting. She needs another letter stating the actual % of window tinting for AS-1 line of windshield. Please fax to [phone] ATTN: Corky Sox

## 2017-01-21 NOTE — Telephone Encounter (Signed)
Letter written and signed by MD.

## 2017-01-25 NOTE — Telephone Encounter (Signed)
Faxed as requested to [phone], attn: Marcie.

## 2017-01-26 DIAGNOSIS — M5412 Radiculopathy, cervical region: Secondary | ICD-10-CM | POA: Diagnosis not present

## 2017-01-26 DIAGNOSIS — M62838 Other muscle spasm: Secondary | ICD-10-CM | POA: Diagnosis not present

## 2017-01-26 DIAGNOSIS — M545 Low back pain: Secondary | ICD-10-CM | POA: Diagnosis not present

## 2017-01-26 DIAGNOSIS — M546 Pain in thoracic spine: Secondary | ICD-10-CM | POA: Diagnosis not present

## 2017-01-27 ENCOUNTER — Encounter: Payer: Self-pay | Admitting: Neurology

## 2017-01-29 ENCOUNTER — Encounter: Payer: Self-pay | Admitting: Neurology

## 2017-01-29 DIAGNOSIS — R1013 Epigastric pain: Secondary | ICD-10-CM | POA: Diagnosis not present

## 2017-01-29 DIAGNOSIS — K2900 Acute gastritis without bleeding: Secondary | ICD-10-CM | POA: Diagnosis not present

## 2017-01-29 DIAGNOSIS — R1011 Right upper quadrant pain: Secondary | ICD-10-CM | POA: Diagnosis not present

## 2017-02-03 ENCOUNTER — Encounter: Payer: Self-pay | Admitting: Gastroenterology

## 2017-02-03 DIAGNOSIS — R110 Nausea: Secondary | ICD-10-CM | POA: Diagnosis not present

## 2017-02-03 DIAGNOSIS — N200 Calculus of kidney: Secondary | ICD-10-CM | POA: Diagnosis not present

## 2017-02-03 DIAGNOSIS — R779 Abnormality of plasma protein, unspecified: Secondary | ICD-10-CM | POA: Diagnosis not present

## 2017-02-03 DIAGNOSIS — N2889 Other specified disorders of kidney and ureter: Secondary | ICD-10-CM | POA: Diagnosis not present

## 2017-02-03 DIAGNOSIS — R1012 Left upper quadrant pain: Secondary | ICD-10-CM | POA: Diagnosis not present

## 2017-02-03 DIAGNOSIS — R1013 Epigastric pain: Secondary | ICD-10-CM | POA: Diagnosis not present

## 2017-02-03 DIAGNOSIS — R1011 Right upper quadrant pain: Secondary | ICD-10-CM | POA: Diagnosis not present

## 2017-02-03 DIAGNOSIS — K760 Fatty (change of) liver, not elsewhere classified: Secondary | ICD-10-CM | POA: Diagnosis not present

## 2017-02-08 ENCOUNTER — Encounter: Payer: Self-pay | Admitting: Neurology

## 2017-02-08 ENCOUNTER — Ambulatory Visit (INDEPENDENT_AMBULATORY_CARE_PROVIDER_SITE_OTHER): Payer: BLUE CROSS/BLUE SHIELD | Admitting: Neurology

## 2017-02-08 ENCOUNTER — Encounter: Payer: Self-pay | Admitting: *Deleted

## 2017-02-08 VITALS — BP 126/90 | HR 86 | Ht 62.0 in | Wt 159.4 lb

## 2017-02-08 DIAGNOSIS — G529 Cranial nerve disorder, unspecified: Secondary | ICD-10-CM | POA: Diagnosis not present

## 2017-02-08 DIAGNOSIS — G43809 Other migraine, not intractable, without status migrainosus: Secondary | ICD-10-CM

## 2017-02-08 DIAGNOSIS — G43109 Migraine with aura, not intractable, without status migrainosus: Secondary | ICD-10-CM | POA: Diagnosis not present

## 2017-02-08 DIAGNOSIS — G500 Trigeminal neuralgia: Secondary | ICD-10-CM

## 2017-02-08 MED ORDER — AMITRIPTYLINE HCL 10 MG PO TABS: 20.0000 mg | ORAL_TABLET | Freq: Every day | ORAL | 3 refills | Status: DC

## 2017-02-08 NOTE — Progress Notes (Signed)
Chloe Pearson    Provider:  Dr Chloe Pearson Referring Provider: Ernestene Kiel, MD Primary Care Physician:  Chloe Kiel, MD  CC: Vertigo  Interval history 02/08/2017: Chloe Pearson continues to have migraines with vertigo. The supraorbital injections help. Discussed botox and Aimovig. Will continue Depakote.  Increase Amitriptyline to 27m qhs.   Interval history 07/28/2016: Patient is here for follow up of vertigo and migraines. Chloe Pearson has a long history of vestibular migraines and [facility] seen multiple [facility]ists [facility] MRIs have been unremarkable. We suggested botox for migraines. Can also try nerve blocks.  Chloe Pearson had vestibular therapy which did not go well. Will perform a supraorbital nerve block today. Also discussed other migraine preventatives we can try, will start low-dose amitriptyline and can titrate as needed if no side effects. No Hx of cardiac problems. Should watch for worsening of dry mouth given her sjogren's, discussed all side effects as per patient AVS.  Medications tried:Depakote (on now), tried multiple triptans and intolerant, Topamax made her fingers hurt and could not try it, lexapro (did not tolerate). Takes meclizine and zofran and xanax for symptoms of vertigo/dizziness. Acutely Chloe Pearson takes tylenol and meclizine or zofran. Scopolamine was too expensive and didn't try it. Amitriptyline.  Interval history 11/26/2015: 51year old patient with 10 year history of vertigo associated with migraines. Chloe Pearson has been evaluated by multiple neurologists and the [facility] vestibular clinic. Chloe Pearson's been diagnosed with vestibular migraines.The Depakote has helped Chloe Pearson doesn't get the roller coaster feeling. The Imitrex increased BP. Since march Chloe Pearson has had 5 a month on average with the vertigo. The scopolamine patch was too expensive. The Depakote seems to be helping a little bit we discussed vestibular migraines or basilar type migraines. Patient suffered  from these for many years and Chloe Pearson's been evaluated multiple times even up to do vestibular clinic. Discussed other options. Chloe Pearson does have Xanax which is a very good vestibular suppressant, I recommend trying that when Chloe Pearson has the dizzy vertigo spells. Reglan is something else we can try. At this point we'll increase her Depakote and see if that helps with prevention.  Medications tried:Depakote (on now), tried multiple triptans and intolerant, Topamax made her fingers hurt and could not try it, lexapro (did not tolerate). Takes meclizine and zofran and xanax for symptoms of vertigo/dizziness. Acutely Chloe Pearson takes tylenol and meclizine or zofran. Scopolamine was too expensive and didn't try it.    HPI: TPaidyn Mcferranis a 51y.o. female here as a referral from Dr. PLaqueta Duefor 10 years of vertigo. Past medical history hypertension, high cholesterol, migraine, anxiety, thyroid disease, migraine. Chloe Pearson also has Sjogren's and Raynaud's syndrome and a history of epilepsy as a child. [facility]. Getting worse as Chloe Pearson gets closer to menopause. Chloe Pearson was diagnosed with vestibular migraines with vertigo. Dizziness has occurred with migraines. The headaches are with photophobia and phonophobia. Chloe Pearson feels like Chloe Pearson is spinning. Chloe Pearson feels like Chloe Pearson is leaning to the right and isn't, like Chloe Pearson is on a tilt-a-whirl. The vertigo will start slowly and progress over days then last all day long and all night and then slowly decrease over a week. This happenens over 2 weeks a month Chloe Pearson will have varying amounts of vertigo. Worsening. Almost fell three times last Monday. No hearing changes. No food triggers or any triggers. Every once in a while Chloe Pearson has migraines with the vertigo.Chloe Pearson has had topamax and made her fingers tingle. OTC meclizine and zofran when it  is really bad. Chloe Pearson gets arm tingling in the fingers with the headaches. Nothing makes the symptoms better. No inciting events or head  trauma. No other focal neurologic deficits or symptoms. Chloe Pearson has been evaluated by ENT without etiology for her vertigo, and Chloe Pearson has had imaging of the brain which is normal.  Reviewed notes, labs and imaging from outside physicians, which showed: Patient was seen by OB/GYN on fibular 27th 2017. Chloe Pearson reported being on hormone therapy for about 4-5 years with ongoing menses. Tried different forms of hormone replacement therapy and even low doses of OCPs. Last menstrual period was April 2016. Chloe Pearson is having hot flashes and night sweats. Declines any further hormonal treatment. Chloe Pearson takes metformin for glucose intolerance and for her hot flashes. Chloe Pearson was seen at [facility] at the vestibular clinic for her vertigo. Chloe Pearson seen an ear nose and throat has normal hearing and no inner ear issues. Chloe Pearson has had imaging of the brain which is normal. Normal pelvic ultrasound in March 2015. Fragmented pleura furtive endometrium. CT scan last February with scattered diverticula, renal stones and hiatal hernia type I. OB/GYN, Chloe Pearson, discussed medications such as SSRIs and Effexor which were declined. Chloe Pearson was offered pelvic ultrasound and declined.  TSH 4.080. Follows with primary care for thyroid management.  MRI of the brain from [facility], reviewed report. Stable and normal cerebral volume. Major intracranial vascular flow voids are stable within normal limits. No restricted diffusion. No midline shift, mass effect, evidence of mass lesion, ventriculomegaly, extra-axial collection or acute intracranial hemorrhage. Cervical medullary junction and pituitary are within normal limits. Negative visualized cervical spine. Gray-white matter signals within normal limits throughout the brain. No abnormal enhancement identified. Stable in normal MRI appearance of the brain. 03/14/2013.  Echocardiogram 03/14/2013. Normal left ventricular size and systolic function. Trace aortic and mitral insufficiency with no significant  structural abnormalities identified. Mild TR, normal pulmonary artery pressure suggested. Aortic root and arch appear normal, no aneurysms or dissection, generally unremarkable echocardiographic study.  Bilateral carotid duplex ultrasound performed at [facility] on 01/02/2010. Mild amount of plaque or intimal thickening in the carotid arteries. No significant carotid artery stenosis. Estimated degree of stenosis in the internal carotid arteries is 50-69% based on the peak systolic velocities which is likely an over estimation and probably less than 50%.  Labs from 07/24/2015: Normal CBC and normal CMP. Creatinine 0.85. CK 96. Hemoglobin A1c 6.2. LDL 121. Sedimentation rate 3. TSH 4.080. Vitamin D 25-hydroxy normal.  Review of Systems: Patient complains of symptoms per HPI as well as the following symptoms: Weight gain, fatigue, blurred vision, easy bruising, feeling hot, feeling cold, joint pain, diarrhea, constipation number ringing in ears, spinning sensation, itching, allergies, skin sensitivity, headache, numbness, dizziness, anxiety, decreased energy, sleepiness. Pertinent negatives per HPI. All others negative.   Social History   Social History  . Marital status: Single    Spouse name: N/A  . Number of children: 0  . Years of education: 68   Occupational History  . [employer]    Social History Main Topics  . Smoking status: Former Smoker    Types: Cigarettes    Quit date: 08/13/2001  . Smokeless tobacco: Never Used  . Alcohol use 0.0 oz/week     Comment: rare  . Drug use: Yes    Types: Other-see comments  . Sexual activity: No     Comment: condoms when needed   Other Topics Concern  . Not on file   Social History Narrative  Lives alone   Caffeine use: Coffee-1 cup per day (2 cups on Friday)   Right-handed    Family History  Problem Relation Age of Onset  . Osteoarthritis Mother   . Diabetes Mother   . Hypertension Mother   . Hyperlipidemia Mother   .  Diabetes Father   . Hyperlipidemia Father   . Hypertension Maternal Grandmother   . Hyperlipidemia Maternal Grandmother   . Stroke Maternal Grandmother   . Diabetes Maternal Grandfather   . Hyperlipidemia Maternal Grandfather   . Hyperlipidemia Paternal Grandmother   . Hyperlipidemia Paternal Grandfather   . Stroke Paternal Grandfather   . Cancer Other   . Cancer Cousin   . Diabetes Paternal Aunt   . Diabetes Paternal Uncle   . Migraines Neg Hx     Past Medical History:  Diagnosis Date  . Anxiety   . Epilepsy [facility])    age 18 to 89  . High cholesterol   . Hypertension   . Hypertension   . Migraine   . Raynaud's disease   . Sjogren's disease ([place])   . Thyroid disease    related to Sjogrens  . Vertigo     Past Surgical History:  Procedure Laterality Date  . CHOLECYSTECTOMY  about 2007    Current Outpatient Prescriptions  Medication Sig Dispense Refill  . acetaminophen (TYLENOL) 500 MG tablet Take 500 mg by mouth every 6 (six) hours as needed.    . ALPRAZolam (XANAX) 0.25 MG tablet Take 1 tablet by mouth 2 (two) times daily.    Marland Kitchen amitriptyline (ELAVIL) 10 MG tablet Take 2 tablets (20 mg total) by mouth at bedtime. 180 tablet 3  . Blood Glucose Monitoring Suppl (ONE TOUCH ULTRA 2) w/Device KIT USE TO CHECK SUGAR TWICE DAILY  0  . BYSTOLIC 5 MG tablet 1/2 tab. Twice daly    . divalproex (DEPAKOTE ER) 250 MG 24 hr tablet Take 1 tablet (250 mg total) by mouth daily. 30 tablet 11  . furosemide (LASIX) 20 MG tablet Take 1 tablet by mouth as needed.    . hydroxychloroquine (PLAQUENIL) 200 MG tablet Take 1 tablet by mouth daily.    Marland Kitchen levothyroxine (SYNTHROID, LEVOTHROID) 75 MCG tablet Take 75 mcg by mouth daily.  4  . metFORMIN (GLUCOPHAGE-XR) 500 MG 24 hr tablet Take 1 tablet by mouth daily with breakfast.   5  . ondansetron (ZOFRAN-ODT) 4 MG disintegrating tablet Take 1 tablet by mouth as needed.    . ONE TOUCH ULTRA TEST test strip USE TO CHECK SUAGR TWICE DAILY  99  .  pravastatin (PRAVACHOL) 20 MG tablet Take 20 mg by mouth daily.  3  . Vitamin D, Ergocalciferol, (DRISDOL) 50000 units CAPS capsule Take 50,000 Units by mouth once a week. Takes every other week  4   No current facility-administered medications for this visit.     Allergies as of 02/08/2017 - Review Complete 02/08/2017  Allergen Reaction Noted  . Oxycodone Itching 08/12/2015  . Shellfish allergy Swelling 08/12/2015  . Cefaclor Rash 08/12/2015  . Penicillins Rash 08/12/2015    Vitals: BP 126/90 (BP Location: Left Arm, Patient Position: Sitting, Cuff Size: Normal)   Pulse 86   Ht _0  (1.575 m)   Wt 159 lb 6.4 oz (72.3 kg)   BMI 29.15 kg/m  Last Weight:  Wt Readings from Last 1 Encounters:  02/08/17 159 lb 6.4 oz (72.3 kg)   Last Height:   Ht Readings from Last 1 Encounters:  02/08/17 5'  2" (1.575 m)   Physical exam: Exam: Gen: NAD, conversant, well nourised, well groomed                     CV: RRR, no MRG. No Carotid Bruits. No peripheral edema, warm, nontender Eyes: Conjunctivae clear without exudates or hemorrhage  Neuro: Detailed Neurologic Exam  Speech:    Speech is normal; fluent and spontaneous with normal comprehension.  Cognition:    The patient is oriented to person, place, and time;     recent and remote memory intact;     language fluent;     normal attention, concentration,     fund of knowledge Cranial Nerves:    The pupils are equal, round, and reactive to light. The fundi are normal and spontaneous venous pulsations are present. Visual fields are full to finger confrontation. Extraocular movements are intact. Trigeminal sensation is intact and the muscles of mastication are normal. The face is symmetric. The palate elevates in the midline. Hearing intact. Voice is normal. Shoulder shrug is normal. The tongue has normal motion without fasciculations.   Coordination:    Normal finger to nose and heel to shin. Normal rapid alternating movements.    Gait:    Heel-toe and tandem gait are normal.   Motor Observation:    No asymmetry, no atrophy, and no involuntary movements noted. Tone:    Normal muscle tone.    Posture:    Posture is normal. normal erect    Strength:    Strength is V/V in the upper and lower limbs.      Sensation: intact to LT     Reflex Exam:  DTR's:    Deep tendon reflexes in the upper and lower extremities are normal bilaterally.   Toes:    The toes are downgoing bilaterally.   Clonus:    Clonus is absent.  Assessment/Plan: 51 year old patient with >10 year history of vertigo associated with migraines. Chloe Pearson has been evaluated by multiple neurologists and the [facility] vestibular clinic. Chloe Pearson's been diagnosed with vestibular migraines.  Lidocaine nosespray prevo on sunset in [place] for acute management, will call and see if they can compound it  vestibular therapy: Did not help Did not tolerate increase in Depakote, cont current dose Will try increasing Amitriptyline  Performed by Dr. Jaynee Pearson M.D. 30-gauge needle was used. All procedures as documented below were medically necessary, reasonable and appropriate based on the patient's history, medical diagnosis and physician opinion. Verbal informed consent was obtained from the patient, patient was informed of potential risk of procedure, including bruising, bleeding, hematoma formation, infection, muscle weakness, muscle pain, numbness, transient hypertension, transient hyperglycemia and transient insomnia among others. All areas injected were topically clean with isopropyl rubbing alcohol. Nonsterile nonlatex gloves were worn during the procedure.  4. Supraorbital nerve block (64400): Supraorbital nerve site was identified along the incision of the frontal bone on the orbital/supraorbital ridge. Medication was injected into the left supraorbital nerve areas. Patient's condition is associated with inflammation of the supraorbital and associated muscle  groups. Injection was deemed medically necessary, reasonable and appropriate. Injection represents a separate and unique surgical service.   Addendum 10/01/2015: Reviewed MRI reports from [facility] radiology dated 03/14/2013.: MRI of the head with and without contrast showed stable in normal cerebral volume, major intracranial vascular flow voids are stable and within normal limits, no restricted diffusion to suggest acute infarct, no midline shift, mass effect, no evidence of mass lesion, ventriculomegaly, extra-axial collection or acute intracranial hemorrhage. Cervical  medullary junction and pituitary are within normal limits. Negative visualized cervical spine. Gray-white matter signal is within normal limits throughout the brain. No abnormal enhancement is identified. Visualized orbit soft tissues are within normal limits. Visualized para nasal sinuses and mastoids are clear. Visible internal auditory structures appear normal. Negative scalp soft tissue, bone marrow signal is normal. Pearson 09/10/2009. Stable in normal MRI appearance of the brain.  Same-day upper extremity artery unilateral: Unremarkable sonographic appearance of the bilateral subclavian arteries. Ordered for intermittent dizziness of 5 years worsened over the past month. Chloe Pearson presented for bilateral duplex carotid ultrasonography was found to have an asymmetric midline bruit concerning for possible subclavian stenosis. Limited evaluation of the bilateral upper extremities, subclavian arteries, was performed in addition to the carotid duplex study. Bilateral carotid duplex ultrasound comparison 01/02/2010 no evidence for carotid artery stenosis and no significant plaque identified.   CC: Dr. Fonnie Birkenhead, MD  [facility] [address] [place], [place] [address]  Phone [phone] Fax [phone]  A total of 15 minutes was spent face-to-face with this patient. Over half this  time was spent on counseling patient on the migraine  diagnosis and different diagnostic and therapeutic options available.

## 2017-02-08 NOTE — Progress Notes (Signed)
Nerve Block:  Lidocaine 2% Lot: [id number] Expiration: 04/2019 NDC: 20254-270-62  Marcaine 0.5% Lot: [id number] Expiration: 08/2018 NDC: 76160-737-10  29mL syringex2- 83mL marcaine, 48mL lidocaine

## 2017-02-08 NOTE — Patient Instructions (Signed)
Remember to drink plenty of fluid, eat healthy meals and do not skip any meals. Try to eat protein with a every meal and eat a healthy snack such as fruit or nuts in between meals. Try to keep a regular sleep-wake schedule and try to exercise daily, particularly in the form of walking, 20-30 minutes a day, if you can.   As far as your medications are concerned, I would like to suggest:  Increase Amitriptyline 20mg  at bedtime  Lidocaine nosespray  Our phone number is [phone]. We also have an after hours call service for urgent matters and there is a physician on-call for urgent questions. For any emergencies you know to call 911 or go to the nearest emergency room

## 2017-02-09 DIAGNOSIS — M461 Sacroiliitis, not elsewhere classified: Secondary | ICD-10-CM | POA: Diagnosis not present

## 2017-02-09 DIAGNOSIS — M542 Cervicalgia: Secondary | ICD-10-CM | POA: Diagnosis not present

## 2017-02-09 DIAGNOSIS — M546 Pain in thoracic spine: Secondary | ICD-10-CM | POA: Diagnosis not present

## 2017-02-09 DIAGNOSIS — M62838 Other muscle spasm: Secondary | ICD-10-CM | POA: Diagnosis not present

## 2017-02-23 DIAGNOSIS — M546 Pain in thoracic spine: Secondary | ICD-10-CM | POA: Diagnosis not present

## 2017-02-23 DIAGNOSIS — M461 Sacroiliitis, not elsewhere classified: Secondary | ICD-10-CM | POA: Diagnosis not present

## 2017-02-23 DIAGNOSIS — M62838 Other muscle spasm: Secondary | ICD-10-CM | POA: Diagnosis not present

## 2017-02-23 DIAGNOSIS — M542 Cervicalgia: Secondary | ICD-10-CM | POA: Diagnosis not present

## 2017-03-09 ENCOUNTER — Encounter: Payer: Self-pay | Admitting: Neurology

## 2017-03-09 DIAGNOSIS — M542 Cervicalgia: Secondary | ICD-10-CM | POA: Diagnosis not present

## 2017-03-09 DIAGNOSIS — G43109 Migraine with aura, not intractable, without status migrainosus: Secondary | ICD-10-CM | POA: Diagnosis not present

## 2017-03-09 DIAGNOSIS — M546 Pain in thoracic spine: Secondary | ICD-10-CM | POA: Diagnosis not present

## 2017-03-09 DIAGNOSIS — M62838 Other muscle spasm: Secondary | ICD-10-CM | POA: Diagnosis not present

## 2017-03-11 ENCOUNTER — Encounter: Payer: Self-pay | Admitting: Neurology

## 2017-03-16 ENCOUNTER — Telehealth: Payer: Self-pay | Admitting: Neurology

## 2017-03-16 NOTE — Telephone Encounter (Signed)
I spoke to Dr. Jaynee Eagles. She will see pt tomorrow for nerve block at 4:30pm. I called pt. She is agreeable to an appt tomorrow at 4:30pm. Pt verbalized understanding of appt date and time.

## 2017-03-16 NOTE — Telephone Encounter (Signed)
Patient is calling to discuss scheduling a nerve block.

## 2017-03-17 ENCOUNTER — Encounter: Payer: Self-pay | Admitting: Neurology

## 2017-03-17 ENCOUNTER — Ambulatory Visit (INDEPENDENT_AMBULATORY_CARE_PROVIDER_SITE_OTHER): Payer: BLUE CROSS/BLUE SHIELD | Admitting: Neurology

## 2017-03-17 VITALS — BP 144/93 | HR 83 | Ht 62.0 in | Wt 159.8 lb

## 2017-03-17 DIAGNOSIS — G529 Cranial nerve disorder, unspecified: Secondary | ICD-10-CM

## 2017-03-17 DIAGNOSIS — G500 Trigeminal neuralgia: Secondary | ICD-10-CM

## 2017-03-17 DIAGNOSIS — G43111 Migraine with aura, intractable, with status migrainosus: Secondary | ICD-10-CM

## 2017-03-17 MED ORDER — CEFALY KIT DEVI: 1.0000 | Freq: Every day | 0 refills | Status: DC

## 2017-03-17 NOTE — Progress Notes (Signed)
Medications not tried for migraines that we may consider in the future: gabapentin, effexor,   Performed by Dr. Jaynee Eagles M.D. 30-gauge needle was used. All procedures asdocumented below were medically necessary, reasonable and appropriate based on the patient's history, medical diagnosis and physician opinion. Verbal informed consent was obtained from the patient, patient was informed of potential risk of procedure, including bruising, bleeding, hematoma formation, infection, muscle weakness, muscle pain, numbness, transient hypertension, transient hyperglycemia and transient insomnia among others. All areas injected were topically clean with isopropyl rubbing alcohol. Nonsterile nonlatex gloves were worn during the procedure.  4. Supraorbital nerve block (64400): Supraorbital nerve site was identified along the incision of the frontal bone on the orbital/supraorbital ridge. Medication was injected into the left and right supraorbital nerve areas. Patient's condition is associated with inflammation of the supraorbital and associated muscle groups. Injection was deemed medically necessary, reasonable and appropriate. Injection represents a separate and unique surgical service.

## 2017-03-17 NOTE — Progress Notes (Signed)
Nerve Block:  Lidocaine/xylocaine 2% Lot: [id number] Expiration: 07/2018 NDC: 76546-503-54  Marcaine/Bupivacaine 0.5% Lot: [id number] Expiration: 08/2018 NDC: 70017-494-49  75mL syringex2- 72mL marcaine, 94mL lidocaine

## 2017-03-18 ENCOUNTER — Encounter: Payer: Self-pay | Admitting: Neurology

## 2017-03-19 ENCOUNTER — Encounter: Payer: Self-pay | Admitting: Neurology

## 2017-03-19 DIAGNOSIS — Z0000 Encounter for general adult medical examination without abnormal findings: Secondary | ICD-10-CM | POA: Diagnosis not present

## 2017-03-19 DIAGNOSIS — R7301 Impaired fasting glucose: Secondary | ICD-10-CM | POA: Diagnosis not present

## 2017-03-19 DIAGNOSIS — G500 Trigeminal neuralgia: Secondary | ICD-10-CM | POA: Insufficient documentation

## 2017-03-19 DIAGNOSIS — E559 Vitamin D deficiency, unspecified: Secondary | ICD-10-CM | POA: Diagnosis not present

## 2017-03-19 DIAGNOSIS — E039 Hypothyroidism, unspecified: Secondary | ICD-10-CM | POA: Diagnosis not present

## 2017-03-19 DIAGNOSIS — Z79899 Other long term (current) drug therapy: Secondary | ICD-10-CM | POA: Diagnosis not present

## 2017-03-24 DIAGNOSIS — M546 Pain in thoracic spine: Secondary | ICD-10-CM | POA: Diagnosis not present

## 2017-03-24 DIAGNOSIS — G43109 Migraine with aura, not intractable, without status migrainosus: Secondary | ICD-10-CM | POA: Diagnosis not present

## 2017-03-24 DIAGNOSIS — M542 Cervicalgia: Secondary | ICD-10-CM | POA: Diagnosis not present

## 2017-03-24 DIAGNOSIS — M545 Low back pain: Secondary | ICD-10-CM | POA: Diagnosis not present

## 2017-03-29 DIAGNOSIS — I10 Essential (primary) hypertension: Secondary | ICD-10-CM | POA: Diagnosis not present

## 2017-03-29 DIAGNOSIS — R7301 Impaired fasting glucose: Secondary | ICD-10-CM | POA: Diagnosis not present

## 2017-03-29 DIAGNOSIS — E559 Vitamin D deficiency, unspecified: Secondary | ICD-10-CM | POA: Diagnosis not present

## 2017-03-29 DIAGNOSIS — G43109 Migraine with aura, not intractable, without status migrainosus: Secondary | ICD-10-CM | POA: Diagnosis not present

## 2017-04-08 DIAGNOSIS — M62838 Other muscle spasm: Secondary | ICD-10-CM | POA: Diagnosis not present

## 2017-04-08 DIAGNOSIS — M546 Pain in thoracic spine: Secondary | ICD-10-CM | POA: Diagnosis not present

## 2017-04-08 DIAGNOSIS — M542 Cervicalgia: Secondary | ICD-10-CM | POA: Diagnosis not present

## 2017-04-08 DIAGNOSIS — R51 Headache: Secondary | ICD-10-CM | POA: Diagnosis not present

## 2017-04-09 ENCOUNTER — Telehealth: Payer: Self-pay | Admitting: *Deleted

## 2017-04-09 ENCOUNTER — Encounter: Payer: Self-pay | Admitting: Neurology

## 2017-04-09 NOTE — Telephone Encounter (Signed)
Called and spoke with Katharine Look at [facility] and gave the new patient appt of November 12th at 10:15am. Also ask for a copy of the pervious pelvic scan.

## 2017-04-20 DIAGNOSIS — R51 Headache: Secondary | ICD-10-CM | POA: Diagnosis not present

## 2017-04-20 DIAGNOSIS — M545 Low back pain: Secondary | ICD-10-CM | POA: Diagnosis not present

## 2017-04-20 DIAGNOSIS — M546 Pain in thoracic spine: Secondary | ICD-10-CM | POA: Diagnosis not present

## 2017-04-20 DIAGNOSIS — M62838 Other muscle spasm: Secondary | ICD-10-CM | POA: Diagnosis not present

## 2017-04-26 ENCOUNTER — Encounter: Payer: Self-pay | Admitting: Neurology

## 2017-04-26 ENCOUNTER — Ambulatory Visit: Payer: BLUE CROSS/BLUE SHIELD | Attending: Gynecologic Oncology | Admitting: Gynecologic Oncology

## 2017-04-26 ENCOUNTER — Encounter: Payer: Self-pay | Admitting: Gynecologic Oncology

## 2017-04-26 VITALS — BP 144/96 | HR 96 | Temp 98.1°F | Resp 20 | Ht 62.87 in | Wt 155.9 lb

## 2017-04-26 DIAGNOSIS — F419 Anxiety disorder, unspecified: Secondary | ICD-10-CM | POA: Insufficient documentation

## 2017-04-26 DIAGNOSIS — M3500 Sicca syndrome, unspecified: Secondary | ICD-10-CM | POA: Diagnosis not present

## 2017-04-26 DIAGNOSIS — Z880 Allergy status to penicillin: Secondary | ICD-10-CM | POA: Insufficient documentation

## 2017-04-26 DIAGNOSIS — Z888 Allergy status to other drugs, medicaments and biological substances status: Secondary | ICD-10-CM | POA: Diagnosis not present

## 2017-04-26 DIAGNOSIS — G40909 Epilepsy, unspecified, not intractable, without status epilepticus: Secondary | ICD-10-CM | POA: Diagnosis not present

## 2017-04-26 DIAGNOSIS — I7300 Raynaud's syndrome without gangrene: Secondary | ICD-10-CM | POA: Diagnosis not present

## 2017-04-26 DIAGNOSIS — Z79899 Other long term (current) drug therapy: Secondary | ICD-10-CM | POA: Insufficient documentation

## 2017-04-26 DIAGNOSIS — Z823 Family history of stroke: Secondary | ICD-10-CM | POA: Insufficient documentation

## 2017-04-26 DIAGNOSIS — E7800 Pure hypercholesterolemia, unspecified: Secondary | ICD-10-CM | POA: Diagnosis not present

## 2017-04-26 DIAGNOSIS — D251 Intramural leiomyoma of uterus: Secondary | ICD-10-CM

## 2017-04-26 DIAGNOSIS — Z8261 Family history of arthritis: Secondary | ICD-10-CM | POA: Insufficient documentation

## 2017-04-26 DIAGNOSIS — Z809 Family history of malignant neoplasm, unspecified: Secondary | ICD-10-CM | POA: Diagnosis not present

## 2017-04-26 DIAGNOSIS — Z91013 Allergy to seafood: Secondary | ICD-10-CM | POA: Insufficient documentation

## 2017-04-26 DIAGNOSIS — I10 Essential (primary) hypertension: Secondary | ICD-10-CM | POA: Diagnosis not present

## 2017-04-26 DIAGNOSIS — Z7984 Long term (current) use of oral hypoglycemic drugs: Secondary | ICD-10-CM | POA: Insufficient documentation

## 2017-04-26 DIAGNOSIS — Z8249 Family history of ischemic heart disease and other diseases of the circulatory system: Secondary | ICD-10-CM | POA: Insufficient documentation

## 2017-04-26 DIAGNOSIS — Z885 Allergy status to narcotic agent status: Secondary | ICD-10-CM | POA: Insufficient documentation

## 2017-04-26 DIAGNOSIS — Z9049 Acquired absence of other specified parts of digestive tract: Secondary | ICD-10-CM | POA: Insufficient documentation

## 2017-04-26 DIAGNOSIS — E079 Disorder of thyroid, unspecified: Secondary | ICD-10-CM | POA: Insufficient documentation

## 2017-04-26 DIAGNOSIS — G43909 Migraine, unspecified, not intractable, without status migrainosus: Secondary | ICD-10-CM | POA: Diagnosis not present

## 2017-04-26 DIAGNOSIS — Z87891 Personal history of nicotine dependence: Secondary | ICD-10-CM | POA: Diagnosis not present

## 2017-04-26 DIAGNOSIS — Z833 Family history of diabetes mellitus: Secondary | ICD-10-CM | POA: Insufficient documentation

## 2017-04-26 NOTE — Patient Instructions (Signed)
Please reach out to Dr Denman George with questions or concerns at [phone].  If you notice new pelvic discomfort that is present every day for 2 weeks in a row or persistent vaginal spotting, please notify your primary doctor, your gynecologist or Dr Denman George.

## 2017-04-26 NOTE — Progress Notes (Signed)
Consult Note: Gyn-Onc  Consult was requested by Dr. Laqueta Due for the evaluation of Chloe Pearson 51 y.o. female  CC:  Chief Complaint  Patient presents with  . Intramural leiomyoma of uterus    Assessment/Plan:  Ms. Blue Ruggerio  is a 51 y.o.  year old with a 2cm uterine fundal fibroid. She is perimenopausal.  I reviewed her CT images and her medical records. I am reassured that this has a very low likelihood of being malignant and does not need scheduled follow-up or serial imaging. If, however, she develops new symptoms of persistent vaginal bleeding or pelvic mass effect symptoms, I recommend pelvic transvaginal US.  She will follow-up with Dr Laqueta Due for follow-up of her symptoms.   HPI: [place] Warriner is a 51 year woman who is seen in consultation at the request of Dr Laqueta Due for a 2cm uterine fiboid.  The patient was having a CT abdo/pelvis with IV contrast in 02/03/17 for upper abdominal pain. It identified a 2cm uterine fibroid in the fundus. She had a CT (uncontrasted) in 2016 which had shown no such lesion. She does have renal stones. She had 2 TV ultrasounds that same year which showed no fibroid.  She had a menstrual period in April, 2017 and then light vaginal spotting in January, 2018 and no bleeding since that time. She has menopausal symptoms.   Current Meds:  Outpatient Encounter Medications as of 04/26/2017  Medication Sig  . acetaminophen (TYLENOL) 500 MG tablet Take 500 mg by mouth every 6 (six) hours as needed.  . ALPRAZolam (XANAX) 0.25 MG tablet Take 1 tablet by mouth 2 (two) times daily.  Marland Kitchen amitriptyline (ELAVIL) 10 MG tablet Take 2 tablets (20 mg total) by mouth at bedtime.  . Blood Glucose Monitoring Suppl (ONE TOUCH ULTRA 2) w/Device KIT USE TO CHECK SUGAR TWICE DAILY  . BYSTOLIC 5 MG tablet 1/2 tab. Twice daly  . divalproex (DEPAKOTE ER) 250 MG 24 hr tablet Take 1 tablet (250 mg total) by mouth daily.  . Ergocalciferol (VITAMIN D2) 2000 units TABS Take 1  tablet daily by mouth.  . hydroxychloroquine (PLAQUENIL) 200 MG tablet Take 1 tablet by mouth daily.  Marland Kitchen levothyroxine (SYNTHROID, LEVOTHROID) 75 MCG tablet Take 75 mcg by mouth daily.  . metFORMIN (GLUCOPHAGE-XR) 500 MG 24 hr tablet Take 1 tablet by mouth daily with breakfast.   . pravastatin (PRAVACHOL) 20 MG tablet Take 20 mg by mouth daily.  . Vitamin D, Ergocalciferol, (DRISDOL) 50000 units CAPS capsule Take 50,000 Units by mouth once a week. Takes every other week  . [DISCONTINUED] furosemide (LASIX) 20 MG tablet Take 1 tablet by mouth as needed.  . [DISCONTINUED] Nerve Stimulator (CEFALY KIT) DEVI 1 kit by Does not apply route at bedtime.  . [DISCONTINUED] ondansetron (ZOFRAN-ODT) 4 MG disintegrating tablet Take 1 tablet by mouth as needed.  . [DISCONTINUED] ONE TOUCH ULTRA TEST test strip USE TO CHECK SUAGR TWICE DAILY   No facility-administered encounter medications on file as of 04/26/2017.     Allergy:  Allergies  Allergen Reactions  . Oxycodone Itching  . Shellfish Allergy Swelling  . Cefaclor Rash  . Penicillins Rash    Social Hx:   Social History   Socioeconomic History  . Marital status: Single    Spouse name: Not on file  . Number of children: 0  . Years of education: 32  . Highest education level: Not on file  Social Needs  . Financial resource strain: Not on file  . Food  insecurity - worry: Not on file  . Food insecurity - inability: Not on file  . Transportation needs - medical: Not on file  . Transportation needs - non-medical: Not on file  Occupational History  . Occupation: [employer]  Tobacco Use  . Smoking status: Former Smoker    Types: Cigarettes    Last attempt to quit: 08/13/2001    Years since quitting: 15.7  . Smokeless tobacco: Never Used  Substance and Sexual Activity  . Alcohol use: Yes    Alcohol/week: 0.0 oz    Comment: rare  . Drug use: Yes    Types: Other-see comments  . Sexual activity: No    Partners: Male    Birth  control/protection: Condom    Comment: condoms when needed  Other Topics Concern  . Not on file  Social History Narrative   Lives alone   Caffeine use: Coffee-1 cup per day (2 cups on Friday)   Right-handed    Past Surgical Hx:  Past Surgical History:  Procedure Laterality Date  . CHOLECYSTECTOMY  about 2007    Past Medical Hx:  Past Medical History:  Diagnosis Date  . Anxiety   . Epilepsy [facility])    age 69 to 69  . High cholesterol   . Hypertension   . Hypertension   . Migraine   . Raynaud's disease   . Sjogren's disease ([place])   . Thyroid disease    related to Sjogrens  . Vertigo     Past Gynecological History:  No abnormal paps. No LMP recorded. Patient is not currently having periods (Reason: Perimenopausal).  Family Hx:  Family History  Problem Relation Age of Onset  . Osteoarthritis Mother   . Diabetes Mother   . Hypertension Mother   . Hyperlipidemia Mother   . Diabetes Father   . Hyperlipidemia Father   . Hypertension Maternal Grandmother   . Hyperlipidemia Maternal Grandmother   . Stroke Maternal Grandmother   . Diabetes Maternal Grandfather   . Hyperlipidemia Maternal Grandfather   . Hyperlipidemia Paternal Grandmother   . Hyperlipidemia Paternal Grandfather   . Stroke Paternal Grandfather   . Cancer Other   . Cancer Cousin   . Diabetes Paternal Aunt   . Diabetes Paternal Uncle   . Migraines Neg Hx     Review of Systems:  Constitutional  Feels well,    ENT Normal appearing ears and nares bilaterally Skin/Breast  No rash, sores, jaundice, itching, dryness Cardiovascular  No chest pain, shortness of breath, or edema  Pulmonary  No cough or wheeze.  Gastro Intestinal  No nausea, vomitting, or diarrhoea. No bright red blood per rectum, no abdominal pain, change in bowel movement, or constipation.  Genito Urinary  No frequency, urgency, dysuria, perimenopausal symptoms. Musculo Skeletal  No myalgia, arthralgia, joint swelling or pain   Neurologic  No weakness, numbness, change in gait,  Psychology  No depression, anxiety, insomnia.   Vitals:  Blood pressure (!) 144/96, pulse 96, temperature 98.1 F (36.7 C), temperature source Oral, resp. rate 20, height 5' 2.87" (1.597 m), weight 155 lb 14.4 oz (70.7 kg).  Physical Exam: Patient declined examination.   Donaciano Eva, MD  04/26/2017, 11:39 AM

## 2017-04-28 NOTE — Telephone Encounter (Signed)
I am happy to add her in at 5pm Monday or at 1230 Monday, this is a supraorbital injection very quick and I can draw up the syring myself.  thanks

## 2017-04-29 ENCOUNTER — Encounter: Payer: Self-pay | Admitting: *Deleted

## 2017-04-29 DIAGNOSIS — M461 Sacroiliitis, not elsewhere classified: Secondary | ICD-10-CM | POA: Diagnosis not present

## 2017-04-29 DIAGNOSIS — M545 Low back pain: Secondary | ICD-10-CM | POA: Diagnosis not present

## 2017-04-29 DIAGNOSIS — R51 Headache: Secondary | ICD-10-CM | POA: Diagnosis not present

## 2017-04-29 DIAGNOSIS — M546 Pain in thoracic spine: Secondary | ICD-10-CM | POA: Diagnosis not present

## 2017-05-12 DIAGNOSIS — J0140 Acute pansinusitis, unspecified: Secondary | ICD-10-CM | POA: Diagnosis not present

## 2017-05-18 DIAGNOSIS — M62838 Other muscle spasm: Secondary | ICD-10-CM | POA: Diagnosis not present

## 2017-05-18 DIAGNOSIS — M542 Cervicalgia: Secondary | ICD-10-CM | POA: Diagnosis not present

## 2017-05-18 DIAGNOSIS — M461 Sacroiliitis, not elsewhere classified: Secondary | ICD-10-CM | POA: Diagnosis not present

## 2017-05-18 DIAGNOSIS — M546 Pain in thoracic spine: Secondary | ICD-10-CM | POA: Diagnosis not present

## 2017-05-31 ENCOUNTER — Encounter: Payer: Self-pay | Admitting: Neurology

## 2017-06-03 ENCOUNTER — Encounter: Payer: Self-pay | Admitting: Neurology

## 2017-06-03 DIAGNOSIS — M546 Pain in thoracic spine: Secondary | ICD-10-CM | POA: Diagnosis not present

## 2017-06-03 DIAGNOSIS — R51 Headache: Secondary | ICD-10-CM | POA: Diagnosis not present

## 2017-06-03 DIAGNOSIS — M542 Cervicalgia: Secondary | ICD-10-CM | POA: Diagnosis not present

## 2017-06-03 DIAGNOSIS — M461 Sacroiliitis, not elsewhere classified: Secondary | ICD-10-CM | POA: Diagnosis not present

## 2017-06-03 NOTE — Telephone Encounter (Signed)
Called and spoke with patient. Per Dr. Jaynee Eagles patient be seen for nerve block. Appointment made with patient for Thurs 06/10/2017 @ 2:15. Patient aware to arrive between 1:45 and 2:00. She verbalized understanding and appreciation.

## 2017-06-10 ENCOUNTER — Ambulatory Visit (INDEPENDENT_AMBULATORY_CARE_PROVIDER_SITE_OTHER): Payer: BLUE CROSS/BLUE SHIELD | Admitting: Neurology

## 2017-06-10 ENCOUNTER — Telehealth: Payer: Self-pay | Admitting: Neurology

## 2017-06-10 ENCOUNTER — Encounter: Payer: Self-pay | Admitting: Neurology

## 2017-06-10 VITALS — BP 140/92 | HR 88 | Ht 62.0 in | Wt 160.0 lb

## 2017-06-10 DIAGNOSIS — R519 Headache, unspecified: Secondary | ICD-10-CM

## 2017-06-10 DIAGNOSIS — R51 Headache: Secondary | ICD-10-CM

## 2017-06-10 DIAGNOSIS — I7771 Dissection of carotid artery: Secondary | ICD-10-CM | POA: Diagnosis not present

## 2017-06-10 DIAGNOSIS — G529 Cranial nerve disorder, unspecified: Secondary | ICD-10-CM

## 2017-06-10 DIAGNOSIS — R0989 Other specified symptoms and signs involving the circulatory and respiratory systems: Secondary | ICD-10-CM | POA: Diagnosis not present

## 2017-06-10 DIAGNOSIS — G500 Trigeminal neuralgia: Secondary | ICD-10-CM

## 2017-06-10 DIAGNOSIS — G43009 Migraine without aura, not intractable, without status migrainosus: Secondary | ICD-10-CM | POA: Diagnosis not present

## 2017-06-10 NOTE — Progress Notes (Signed)
Medications not tried for migraines that we may consider in the future: gabapentin, effexor,   Performed by Dr. Jaynee Eagles M.D. 30-gauge needle was used. All procedures asdocumented below were medically necessary, reasonable and appropriate based on the patient's history, medical diagnosis and physician opinion. Verbal informed consent was obtained from the patient, patient was informed of potential risk of procedure, including bruising, bleeding, hematoma formation, infection, muscle weakness, muscle pain, numbness, transient hypertension, transient hyperglycemia and transient insomnia among others. All areas injected were topically clean with isopropyl rubbing alcohol. Nonsterile nonlatex gloves were worn during the procedure.  4. Supraorbital nerve block (64400): Supraorbital nerve site was identified along the incision of the frontal bone on the orbital/supraorbital ridge. Medication was injected into the left and right supraorbital nerve areas. Patient's condition is associated with inflammation of the supraorbital and associated muscle groups. Injection was deemed medically necessary, reasonable and appropriate. Injection represents a separate and unique surgical service.   Patient also with acute onset neck pain (points to the carotid) radiating to the left parietal area. Concern for carotid dissection. Discussed, will order CTA head and neck to further evaluate for dissection or stroke. Bruit of left carotid possibly heard.  Discussed Ajovy, patient will research and let us know  Discussed Cephaly, patient to borrow Cephaly device and return it in 4-6 weeks  A total of 15 additional minutes(this does no tinclude the nerve block) was spent in with this patient face-to-face. Over half this time was spent on counseling patient on the migraine, carotid dissection diagnosis and different therapeutic options available.

## 2017-06-10 NOTE — Progress Notes (Signed)
Nerve block injections prepared as follows using aseptic technique: 2 3 mL syringes with 1:1 bupivacaine 0.5% and xylocaine 2%.  1.5 mL of xylocaine and 1.5 mL of bupivacaine in each 3 mL syringe.  Bupivacaine 0.5% multidose vial, 250mg /50 mL Lot: [id number] Exp: [id number] NDC:0409-1163-18  Xylocaine 2% multidose vial 400mg /20 mL [id number] Exp: 02/22 MBE:67544-920-10

## 2017-06-10 NOTE — Telephone Encounter (Signed)
Patient has a fu apt with Dr. Jaynee Eagles In February . Dr. Jaynee Eagles want's her to Have CT at [place] Patient agree's .

## 2017-06-17 ENCOUNTER — Encounter: Payer: Self-pay | Admitting: Neurology

## 2017-06-17 DIAGNOSIS — M545 Low back pain: Secondary | ICD-10-CM | POA: Diagnosis not present

## 2017-06-17 DIAGNOSIS — G43109 Migraine with aura, not intractable, without status migrainosus: Secondary | ICD-10-CM | POA: Diagnosis not present

## 2017-06-17 DIAGNOSIS — M62838 Other muscle spasm: Secondary | ICD-10-CM | POA: Diagnosis not present

## 2017-06-17 DIAGNOSIS — M546 Pain in thoracic spine: Secondary | ICD-10-CM | POA: Diagnosis not present

## 2017-06-17 NOTE — Telephone Encounter (Signed)
Noted, Thank you I sent the order to [place].

## 2017-06-22 DIAGNOSIS — M546 Pain in thoracic spine: Secondary | ICD-10-CM | POA: Diagnosis not present

## 2017-06-22 DIAGNOSIS — M542 Cervicalgia: Secondary | ICD-10-CM | POA: Diagnosis not present

## 2017-06-22 DIAGNOSIS — M461 Sacroiliitis, not elsewhere classified: Secondary | ICD-10-CM | POA: Diagnosis not present

## 2017-06-22 DIAGNOSIS — G43109 Migraine with aura, not intractable, without status migrainosus: Secondary | ICD-10-CM | POA: Diagnosis not present

## 2017-06-22 DIAGNOSIS — Z1231 Encounter for screening mammogram for malignant neoplasm of breast: Secondary | ICD-10-CM | POA: Diagnosis not present

## 2017-06-28 ENCOUNTER — Other Ambulatory Visit: Payer: BLUE CROSS/BLUE SHIELD

## 2017-06-29 ENCOUNTER — Ambulatory Visit
Admission: RE | Admit: 2017-06-29 | Discharge: 2017-06-29 | Disposition: A | Discharge status: Home / Self Care | Payer: BLUE CROSS/BLUE SHIELD | Source: Ambulatory Visit | Attending: Neurology | Admitting: Neurology

## 2017-06-29 DIAGNOSIS — R51 Headache: Secondary | ICD-10-CM

## 2017-06-29 DIAGNOSIS — I7771 Dissection of carotid artery: Secondary | ICD-10-CM

## 2017-06-29 DIAGNOSIS — R0989 Other specified symptoms and signs involving the circulatory and respiratory systems: Secondary | ICD-10-CM

## 2017-06-29 DIAGNOSIS — R519 Headache, unspecified: Secondary | ICD-10-CM

## 2017-06-29 DIAGNOSIS — I6523 Occlusion and stenosis of bilateral carotid arteries: Secondary | ICD-10-CM | POA: Diagnosis not present

## 2017-06-29 MED ORDER — IOPAMIDOL (ISOVUE-370) INJECTION 76%
75.0000 mL | Freq: Once | INTRAVENOUS | Status: AC | PRN
  Administered 2017-06-29: 75 mL via INTRAVENOUS

## 2017-06-30 ENCOUNTER — Telehealth: Payer: Self-pay | Admitting: *Deleted

## 2017-06-30 DIAGNOSIS — G43109 Migraine with aura, not intractable, without status migrainosus: Secondary | ICD-10-CM | POA: Diagnosis not present

## 2017-06-30 DIAGNOSIS — M546 Pain in thoracic spine: Secondary | ICD-10-CM | POA: Diagnosis not present

## 2017-06-30 DIAGNOSIS — M461 Sacroiliitis, not elsewhere classified: Secondary | ICD-10-CM | POA: Diagnosis not present

## 2017-06-30 DIAGNOSIS — M542 Cervicalgia: Secondary | ICD-10-CM | POA: Diagnosis not present

## 2017-06-30 NOTE — Telephone Encounter (Addendum)
Called patient and discussed MRA head & neck results. She is aware that the blood vessels look very nice. She has some mild atherosclerosis in the right proximal carotid artery likely due  HTN and HLD and also aging, but nothing concerning, recommend daily aspirin if she does not have any contraindications and close management of blood pressure and cholesterol. She states she is currently taking medication for both HTN & Cholesterol. She denies any bleeding problems or allergies to aspirin. She will take 81 mg (per Dr. Jaynee Eagles) of aspirin daily, purchased OTC. She also wanted Dr. Jaynee Eagles to know (Dr. Jaynee Eagles aware) that she had another episode where she was woken up out of her sleep last night with pain over her L eyebrow.  She took Tylenol went back to sleep and did not have any pain when she woke up. Denied any other symptoms (informed pt if any new symptoms ex. Numbness/weakness/aphasia/slurred speech/vision changes/balance issues/worst h/a of life to go to ED).She states she has tried to use the Cefaly and increase her time but it does hurt. She will try again. She verbalized appreciation for the call.    ----- Message from Melvenia Beam, MD sent at 06/29/2017  5:36 PM EST ----- The blood vessels look very nice. She hCas some mild atherosclerosis in the right proximal carotid artery likely due  HTN and HLD and also aging, but nothing concerning, recommend daily aspirin if she does not have any contraindications and close management of blood pressure and cholesterol. Overall looks very good thanks

## 2017-07-15 DIAGNOSIS — M545 Low back pain: Secondary | ICD-10-CM | POA: Diagnosis not present

## 2017-07-15 DIAGNOSIS — M546 Pain in thoracic spine: Secondary | ICD-10-CM | POA: Diagnosis not present

## 2017-07-15 DIAGNOSIS — M62838 Other muscle spasm: Secondary | ICD-10-CM | POA: Diagnosis not present

## 2017-07-15 DIAGNOSIS — M542 Cervicalgia: Secondary | ICD-10-CM | POA: Diagnosis not present

## 2017-07-20 ENCOUNTER — Encounter: Payer: Self-pay | Admitting: Neurology

## 2017-07-22 ENCOUNTER — Ambulatory Visit: Payer: BLUE CROSS/BLUE SHIELD | Admitting: Neurology

## 2017-07-26 DIAGNOSIS — M5412 Radiculopathy, cervical region: Secondary | ICD-10-CM | POA: Diagnosis not present

## 2017-07-26 DIAGNOSIS — M546 Pain in thoracic spine: Secondary | ICD-10-CM | POA: Diagnosis not present

## 2017-07-26 DIAGNOSIS — M461 Sacroiliitis, not elsewhere classified: Secondary | ICD-10-CM | POA: Diagnosis not present

## 2017-07-26 DIAGNOSIS — M545 Low back pain: Secondary | ICD-10-CM | POA: Diagnosis not present

## 2017-07-30 DIAGNOSIS — R7301 Impaired fasting glucose: Secondary | ICD-10-CM | POA: Diagnosis not present

## 2017-07-30 DIAGNOSIS — E785 Hyperlipidemia, unspecified: Secondary | ICD-10-CM | POA: Diagnosis not present

## 2017-07-30 DIAGNOSIS — Z79899 Other long term (current) drug therapy: Secondary | ICD-10-CM | POA: Diagnosis not present

## 2017-07-30 DIAGNOSIS — M2550 Pain in unspecified joint: Secondary | ICD-10-CM | POA: Diagnosis not present

## 2017-07-30 DIAGNOSIS — I10 Essential (primary) hypertension: Secondary | ICD-10-CM | POA: Diagnosis not present

## 2017-07-30 DIAGNOSIS — E039 Hypothyroidism, unspecified: Secondary | ICD-10-CM | POA: Diagnosis not present

## 2017-08-02 ENCOUNTER — Ambulatory Visit: Payer: BLUE CROSS/BLUE SHIELD | Admitting: Neurology

## 2017-08-02 ENCOUNTER — Encounter: Payer: Self-pay | Admitting: Neurology

## 2017-08-02 VITALS — BP 141/88 | HR 82 | Ht 62.0 in | Wt 160.0 lb

## 2017-08-02 DIAGNOSIS — G529 Cranial nerve disorder, unspecified: Secondary | ICD-10-CM | POA: Diagnosis not present

## 2017-08-02 DIAGNOSIS — G43011 Migraine without aura, intractable, with status migrainosus: Secondary | ICD-10-CM

## 2017-08-02 DIAGNOSIS — G500 Trigeminal neuralgia: Secondary | ICD-10-CM

## 2017-08-02 NOTE — Progress Notes (Addendum)
Nerve block w/o Steroid: 0.5 % Bupivocaine 7.5 mL total LOT: [id number] EXP: 10/14/2018 NDC: 2060-1561-53  2% Lidocaine 4.5 mL total LOT: [id number] EXP: 02/14/2019 NDC: 7943-2761-47 //BCrn

## 2017-08-02 NOTE — Progress Notes (Signed)
Performed by Dr. Jaynee Eagles M.D.  All procedures a documented blood were medically necessary, reasonable and appropriate based on the patient's history, medical diagnosis and physician opinion. Verbal informed consent was obtained from the patient, patient was informed of potential risk of procedure, including bruising, bleeding, hematoma formation, infection, muscle weakness, muscle pain, numbness, transient hypertension, transient hyperglycemia and transient insomnia among others. All areas injected were topically clean with isopropyl rubbing alcohol. Nonsterile nonlatex gloves were worn during the procedure.  Supraorbital nerve block (64400): Supraorbital nerve site was identified along the incision of the frontal bone on the orbital/supraorbital ridge. Medication was injected into the left and right supraorbital nerve areas. Patient's condition is associated with inflammation of the supraorbital and associated muscle groups. Injection was deemed medically necessary, reasonable and appropriate. Injection represents a separate and unique surgical service.

## 2017-08-04 ENCOUNTER — Ambulatory Visit: Payer: Self-pay | Admitting: Neurology

## 2017-08-06 DIAGNOSIS — I10 Essential (primary) hypertension: Secondary | ICD-10-CM | POA: Diagnosis not present

## 2017-08-06 DIAGNOSIS — R7301 Impaired fasting glucose: Secondary | ICD-10-CM | POA: Diagnosis not present

## 2017-08-06 DIAGNOSIS — E785 Hyperlipidemia, unspecified: Secondary | ICD-10-CM | POA: Diagnosis not present

## 2017-08-06 DIAGNOSIS — G43109 Migraine with aura, not intractable, without status migrainosus: Secondary | ICD-10-CM | POA: Diagnosis not present

## 2017-08-10 DIAGNOSIS — M546 Pain in thoracic spine: Secondary | ICD-10-CM | POA: Diagnosis not present

## 2017-08-10 DIAGNOSIS — M542 Cervicalgia: Secondary | ICD-10-CM | POA: Diagnosis not present

## 2017-08-10 DIAGNOSIS — M62838 Other muscle spasm: Secondary | ICD-10-CM | POA: Diagnosis not present

## 2017-08-10 DIAGNOSIS — M545 Low back pain: Secondary | ICD-10-CM | POA: Diagnosis not present

## 2017-09-01 DIAGNOSIS — R51 Headache: Secondary | ICD-10-CM | POA: Diagnosis not present

## 2017-09-01 DIAGNOSIS — M546 Pain in thoracic spine: Secondary | ICD-10-CM | POA: Diagnosis not present

## 2017-09-01 DIAGNOSIS — M542 Cervicalgia: Secondary | ICD-10-CM | POA: Diagnosis not present

## 2017-09-01 DIAGNOSIS — M62838 Other muscle spasm: Secondary | ICD-10-CM | POA: Diagnosis not present

## 2017-09-02 DIAGNOSIS — E790 Hyperuricemia without signs of inflammatory arthritis and tophaceous disease: Secondary | ICD-10-CM | POA: Diagnosis not present

## 2017-09-10 DIAGNOSIS — J019 Acute sinusitis, unspecified: Secondary | ICD-10-CM | POA: Diagnosis not present

## 2017-09-10 DIAGNOSIS — R300 Dysuria: Secondary | ICD-10-CM | POA: Diagnosis not present

## 2017-09-13 ENCOUNTER — Encounter: Payer: Self-pay | Admitting: Neurology

## 2017-09-14 NOTE — Telephone Encounter (Signed)
Spoke with pt and scheduled her for Tues 4/8 @ 4:30 arrival time 4:00. She verbalized appreciation.

## 2017-09-15 DIAGNOSIS — R51 Headache: Secondary | ICD-10-CM | POA: Diagnosis not present

## 2017-09-15 DIAGNOSIS — M542 Cervicalgia: Secondary | ICD-10-CM | POA: Diagnosis not present

## 2017-09-15 DIAGNOSIS — M62838 Other muscle spasm: Secondary | ICD-10-CM | POA: Diagnosis not present

## 2017-09-15 DIAGNOSIS — M546 Pain in thoracic spine: Secondary | ICD-10-CM | POA: Diagnosis not present

## 2017-09-17 ENCOUNTER — Encounter: Payer: Self-pay | Admitting: Neurology

## 2017-09-17 ENCOUNTER — Telehealth: Payer: Self-pay | Admitting: Neurology

## 2017-09-21 ENCOUNTER — Ambulatory Visit: Payer: Self-pay | Admitting: Neurology

## 2017-09-22 ENCOUNTER — Encounter: Payer: Self-pay | Admitting: Neurology

## 2017-09-22 ENCOUNTER — Ambulatory Visit: Payer: BLUE CROSS/BLUE SHIELD | Admitting: Neurology

## 2017-09-22 VITALS — BP 138/90 | HR 86

## 2017-09-22 DIAGNOSIS — G500 Trigeminal neuralgia: Secondary | ICD-10-CM

## 2017-09-22 NOTE — Progress Notes (Signed)
Nerve block w/o steroid:  Consent signed by pt & sent to medical records  0.5% Bupivocaine 4 mL total LOT: [id number] EXP: 10/14/2018  2% Lidocaine 2 mL total LOT: [id number] EXP: 02/14/2019  //BCrn

## 2017-09-22 NOTE — Progress Notes (Signed)
Performed by Dr. Jaynee Eagles M.D. All procedures a documented blood were medically necessary, reasonable and appropriate based on the patient's history, medical diagnosis and physician opinion. Verbal informed consent was obtained from the patient, patient was informed of potential risk of procedure, including bruising, bleeding, hematoma formation, infection, muscle weakness, muscle pain, numbness, transient hypertension, transient hyperglycemia and transient insomnia among others. All areas injected were topically clean with isopropyl rubbing alcohol. Nonsterile nonlatex gloves were worn during the procedure.  4. Supraorbital nerve block (64400): Supraorbital nerve site was identified along the incision of the frontal bone on the orbital/supraorbital ridge. Medication was injected into the left and right supraorbital nerve areas. Patient's condition is associated with inflammation of the supraorbital and associated muscle groups. Injection was deemed medically necessary, reasonable and appropriate. Injection represents a separate and unique surgical service.

## 2017-09-23 DIAGNOSIS — N200 Calculus of kidney: Secondary | ICD-10-CM | POA: Diagnosis not present

## 2017-09-25 DIAGNOSIS — M546 Pain in thoracic spine: Secondary | ICD-10-CM | POA: Diagnosis not present

## 2017-09-25 DIAGNOSIS — M461 Sacroiliitis, not elsewhere classified: Secondary | ICD-10-CM | POA: Diagnosis not present

## 2017-09-25 DIAGNOSIS — M545 Low back pain: Secondary | ICD-10-CM | POA: Diagnosis not present

## 2017-09-25 DIAGNOSIS — M542 Cervicalgia: Secondary | ICD-10-CM | POA: Diagnosis not present

## 2017-09-27 ENCOUNTER — Encounter: Payer: Self-pay | Admitting: Gastroenterology

## 2017-10-05 NOTE — Telephone Encounter (Signed)
error 

## 2017-10-12 DIAGNOSIS — M62838 Other muscle spasm: Secondary | ICD-10-CM | POA: Diagnosis not present

## 2017-10-12 DIAGNOSIS — M545 Low back pain: Secondary | ICD-10-CM | POA: Diagnosis not present

## 2017-10-12 DIAGNOSIS — M546 Pain in thoracic spine: Secondary | ICD-10-CM | POA: Diagnosis not present

## 2017-10-12 DIAGNOSIS — M461 Sacroiliitis, not elsewhere classified: Secondary | ICD-10-CM | POA: Diagnosis not present

## 2017-10-27 ENCOUNTER — Encounter: Payer: Self-pay | Admitting: Gastroenterology

## 2017-10-27 DIAGNOSIS — M461 Sacroiliitis, not elsewhere classified: Secondary | ICD-10-CM | POA: Diagnosis not present

## 2017-10-27 DIAGNOSIS — M545 Low back pain: Secondary | ICD-10-CM | POA: Diagnosis not present

## 2017-10-27 DIAGNOSIS — M62838 Other muscle spasm: Secondary | ICD-10-CM | POA: Diagnosis not present

## 2017-10-27 DIAGNOSIS — M546 Pain in thoracic spine: Secondary | ICD-10-CM | POA: Diagnosis not present

## 2017-10-29 ENCOUNTER — Ambulatory Visit (INDEPENDENT_AMBULATORY_CARE_PROVIDER_SITE_OTHER): Payer: BLUE CROSS/BLUE SHIELD | Admitting: Gastroenterology

## 2017-10-29 ENCOUNTER — Encounter: Payer: Self-pay | Admitting: Gastroenterology

## 2017-10-29 VITALS — BP 128/84 | HR 74 | Ht 62.0 in | Wt 160.2 lb

## 2017-10-29 DIAGNOSIS — Z8371 Family history of colonic polyps: Secondary | ICD-10-CM

## 2017-10-29 DIAGNOSIS — K589 Irritable bowel syndrome without diarrhea: Secondary | ICD-10-CM

## 2017-10-29 DIAGNOSIS — Z1211 Encounter for screening for malignant neoplasm of colon: Secondary | ICD-10-CM | POA: Diagnosis not present

## 2017-10-29 DIAGNOSIS — Z83719 Family history of colon polyps, unspecified: Secondary | ICD-10-CM

## 2017-10-29 MED ORDER — SOD PICOSULFATE-MAG OX-CIT ACD 10-3.5-12 MG-GM -GM/160ML PO SOLN: 1.0000 | Freq: Once | ORAL | 0 refills | Status: AC

## 2017-10-29 NOTE — Progress Notes (Signed)
Chief Complaint: For colonoscopy  Referring Provider:  Ernestene Kiel, MD      ASSESSMENT AND PLAN;   #1.  Colorectal cancer screening - Proceed with colonoscopy.  I have discussed the risks and benefits.  The risks including risk of perforation requiring laparotomy, bleeding after polypectomy requiring blood transfusions and risks of anesthesia/sedation were discussed.  Rare risks of missing colorectal neoplasms were also discussed.  Alternatives were given.  Patient is fully aware and agrees to proceed. All the questions were answered. Colonoscopy will be scheduled in upcoming days.  Patient is to report immediately if there is any significant weight loss or excessive bleeding until then. Consent forms were given for review.   #2. H/O Gluten sensitivity, no celiac on serology or Bx  #3. FH of colonic polyps - mom and dad (both below 76). #4. IBS with alt diarrhea and constipation. Better with gluten free diet. Neg CT 02/03/2017: neg except small HH, small uterine fibroid.   HPI:    Chloe Pearson is a 52 y.o. female  Here for screening. Has occ diarrhea and constipation with bloating. When she having diarrhea - 1-2/day, watery, with mucus, with abdominal pain which gets better - then goes towards constipation. More diarrhea with milk and salads. Neg CT 02/03/2017: neg except small HH, small uterine fibroid. Has dry mouth due to Sjogren's, better with increased water intake.  Past Medical History:  Diagnosis Date  . Anxiety   . Epilepsy [facility])    age 16 to 67  . High cholesterol   . Hypertension   . Hypertension   . Migraine   . Raynaud's disease   . Sjogren's disease ([place])   . Thyroid disease    related to Sjogrens  . Vertigo     Past Surgical History:  Procedure Laterality Date  . CHOLECYSTECTOMY  10/2007  . UPPER GI ENDOSCOPY  09/07/2007   minimal antral gastritis. BX: small bowel muscosa with architechturally normal villi and prominent lymphoid aggregates.     Family History  Problem Relation Age of Onset  . Osteoarthritis Mother   . Diabetes Mother   . Hypertension Mother   . Hyperlipidemia Mother   . Diabetes Father   . Hyperlipidemia Father   . Hypertension Maternal Grandmother   . Hyperlipidemia Maternal Grandmother   . Stroke Maternal Grandmother   . Diabetes Maternal Grandfather   . Hyperlipidemia Maternal Grandfather   . Hyperlipidemia Paternal Grandmother   . Hyperlipidemia Paternal Grandfather   . Stroke Paternal Grandfather   . Cancer Other   . Cancer Cousin   . Diabetes Paternal Aunt   . Diabetes Paternal Uncle   . Migraines Neg Hx     Social History   Tobacco Use  . Smoking status: Former Smoker    Types: Cigarettes    Last attempt to quit: 08/13/2001    Years since quitting: 16.2  . Smokeless tobacco: Never Used  Substance Use Topics  . Alcohol use: Yes    Alcohol/week: 0.0 oz    Comment: rare  . Drug use: No    Comment: prescription Xanax     Current Outpatient Medications  Medication Sig Dispense Refill  . acetaminophen (TYLENOL) 500 MG tablet Take 500 mg by mouth every 6 (six) hours as needed.    . ALPRAZolam (XANAX) 0.25 MG tablet Take 1 tablet by mouth 2 (two) times daily.    Marland Kitchen amitriptyline (ELAVIL) 10 MG tablet Take 2 tablets (20 mg total) by mouth at bedtime. 180 tablet  3  . aspirin 81 MG tablet Take 81 mg by mouth daily.    Marland Kitchen BYSTOLIC 5 MG tablet 1/2 tab. Twice daly    . divalproex (DEPAKOTE ER) 250 MG 24 hr tablet Take 1 tablet (250 mg total) by mouth daily. (Patient taking differently: Take 250 mg by mouth at bedtime. ) 30 tablet 11  . Ergocalciferol (VITAMIN D2) 2000 units TABS Take 1 tablet daily by mouth.    Marland Kitchen glucose blood test strip 1 each by Other route as needed for other. Use as instructed Accu check    . hydroxychloroquine (PLAQUENIL) 200 MG tablet Take 1 tablet by mouth 2 (two) times daily.     Marland Kitchen levothyroxine (SYNTHROID, LEVOTHROID) 75 MCG tablet Take 75 mcg by mouth daily.  4  .  meclizine (ANTIVERT) 25 MG tablet Take 25 mg by mouth 3 (three) times daily as needed for dizziness.    . metFORMIN (GLUCOPHAGE-XR) 500 MG 24 hr tablet Take 1 tablet by mouth at bedtime.   5  . ondansetron (ZOFRAN) 4 MG tablet Take 4 mg by mouth every 8 (eight) hours as needed for nausea or vomiting.    . pravastatin (PRAVACHOL) 20 MG tablet Take 20 mg by mouth daily.  3  . Vitamin D, Ergocalciferol, (DRISDOL) 50000 units CAPS capsule Take 50,000 Units by mouth once a week. Takes every other week  4   No current facility-administered medications for this visit.     Allergies  Allergen Reactions  . Oxycodone Itching  . Shellfish Allergy Swelling  . Cefaclor Rash  . Penicillins Rash    Review of Systems:  Constitutional: Denies fever, chills, diaphoresis, appetite change and fatigue.  HEENT: Denies photophobia, eye pain, redness, hearing loss, ear pain, congestion, sore throat, rhinorrhea, sneezing, mouth sores, neck pain, neck stiffness and tinnitus.   Respiratory: Denies SOB, DOE, cough, chest tightness,  and wheezing.   Cardiovascular: Denies chest pain, palpitations and leg swelling.  Genitourinary: Denies dysuria, urgency, frequency, hematuria, flank pain and difficulty urinating.  Musculoskeletal: Denies myalgias, back pain, joint swelling, arthralgias and gait problem.  Skin: No rash.  Neurological: Denies dizziness, seizures, syncope, weakness, light-headedness, numbness and headaches.  Hematological: Denies adenopathy. Easy bruising, personal or family bleeding history  Psychiatric/Behavioral: Has anxiety or depression     Physical Exam:    BP 128/84   Pulse 74   Ht 5\' 2"  (1.575 m)   Wt 160 lb 4 oz (72.7 kg)   BMI 29.31 kg/m  Filed Weights   10/29/17 1354  Weight: 160 lb 4 oz (72.7 kg)   Constitutional:  Well-developed, in no acute distress. Psychiatric: Normal mood and affect. Behavior is normal. HEENT: Pupils normal.  Conjunctivae are normal. No scleral  icterus. Neck supple.  Cardiovascular: Normal rate, regular rhythm. No edema Pulmonary/chest: Effort normal and breath sounds normal. No wheezing, rales or rhonchi. Abdominal: Soft, nondistended. Nontender. Bowel sounds active throughout. There are no masses palpable. No hepatomegaly. Rectal:  defered Neurological: Alert and oriented to person place and time. Skin: Skin is warm and dry. No rashes noted.    Carmell Austria, MD 10/29/2017, 2:07 PM  Cc: Ernestene Kiel, MD

## 2017-10-29 NOTE — Patient Instructions (Addendum)
If you are age 51 or older, your body mass index should be between 23-30. Your Body mass index is 29.31 kg/m. If this is out of the aforementioned range listed, please consider follow up with your Primary Care Provider.  If you are age 73 or younger, your body mass index should be between 19-25. Your Body mass index is 29.31 kg/m. If this is out of the aformentioned range listed, please consider follow up with your Primary Care Provider.   You have been scheduled for a colonoscopy. Please follow written instructions given to you at your visit today.  Please pick up your prep supplies at the pharmacy within the next 1-3 days. If you use inhalers (even only as needed), please bring them with you on the day of your procedure. Your physician has requested that you go to www.startemmi.com and enter the access code given to you at your visit today. This web site gives a general overview about your procedure. However, you should still follow specific instructions given to you by our office regarding your preparation for the procedure.  We have sent the following medications to your pharmacy for you to pick up at your convenience: Clenpiq  Thank you,  Dr. Jackquline Denmark

## 2017-11-11 DIAGNOSIS — M461 Sacroiliitis, not elsewhere classified: Secondary | ICD-10-CM | POA: Diagnosis not present

## 2017-11-11 DIAGNOSIS — M542 Cervicalgia: Secondary | ICD-10-CM | POA: Diagnosis not present

## 2017-11-11 DIAGNOSIS — R51 Headache: Secondary | ICD-10-CM | POA: Diagnosis not present

## 2017-11-11 DIAGNOSIS — M546 Pain in thoracic spine: Secondary | ICD-10-CM | POA: Diagnosis not present

## 2017-11-16 ENCOUNTER — Encounter: Payer: Self-pay | Admitting: Neurology

## 2017-11-22 ENCOUNTER — Ambulatory Visit: Payer: BLUE CROSS/BLUE SHIELD | Admitting: Neurology

## 2017-11-22 ENCOUNTER — Encounter: Payer: BLUE CROSS/BLUE SHIELD | Admitting: Gastroenterology

## 2017-11-22 ENCOUNTER — Encounter: Payer: Self-pay | Admitting: Neurology

## 2017-11-22 VITALS — BP 136/91 | HR 89

## 2017-11-22 DIAGNOSIS — G500 Trigeminal neuralgia: Secondary | ICD-10-CM

## 2017-11-22 MED ORDER — AMITRIPTYLINE HCL 10 MG PO TABS: 10.0000 mg | ORAL_TABLET | Freq: Every day | ORAL | 11 refills | Status: DC

## 2017-11-22 NOTE — Progress Notes (Signed)
Patient with supraorbital neuralgia. Does extremely well with supraorbital nerve blocks. Will refer to see if radio frequency ablation of the supraorbital nerves can be performed.  Performed by Dr. Jaynee Eagles M.D. All procedures a documented blood were medically necessary, reasonable and appropriate based on the patient's history, medical diagnosis and physician opinion. Verbal informed consent was obtained from the patient, patient was informed of potential risk of procedure, including bruising, bleeding, hematoma formation, infection, muscle weakness, muscle pain, numbness, transient hypertension, transient hyperglycemia and transient insomnia among others. All areas injected were topically clean with isopropyl rubbing alcohol. Nonsterile nonlatex gloves were worn during the procedure.  4. Supraorbital nerve block (64400): Supraorbital nerve site was identified along the incision of the frontal bone on the orbital/supraorbital ridge. Medication was injected into the left and right supraorbital nerve areas. Patient's condition is associated with inflammation of the supraorbital and associated muscle groups. Injection was deemed medically necessary, reasonable and appropriate. Injection represents a separate and unique surgical service.

## 2017-11-22 NOTE — Progress Notes (Signed)
Nerve block w/o steroid: Pt signed consent  0.5% Bupivocaine 3 mL LOT: [id number] EXP: 09/22  2% Lidocaine 3 mL LOT: [id number] EXP: 02/14/2019

## 2017-11-23 ENCOUNTER — Encounter: Payer: Self-pay | Admitting: Gastroenterology

## 2017-11-24 ENCOUNTER — Other Ambulatory Visit: Payer: Self-pay | Admitting: Neurology

## 2017-11-26 DIAGNOSIS — R7301 Impaired fasting glucose: Secondary | ICD-10-CM | POA: Diagnosis not present

## 2017-11-26 DIAGNOSIS — Z79899 Other long term (current) drug therapy: Secondary | ICD-10-CM | POA: Diagnosis not present

## 2017-11-26 DIAGNOSIS — E559 Vitamin D deficiency, unspecified: Secondary | ICD-10-CM | POA: Diagnosis not present

## 2017-11-26 DIAGNOSIS — E790 Hyperuricemia without signs of inflammatory arthritis and tophaceous disease: Secondary | ICD-10-CM | POA: Diagnosis not present

## 2017-11-26 DIAGNOSIS — E785 Hyperlipidemia, unspecified: Secondary | ICD-10-CM | POA: Diagnosis not present

## 2017-11-29 DIAGNOSIS — M542 Cervicalgia: Secondary | ICD-10-CM | POA: Diagnosis not present

## 2017-11-29 DIAGNOSIS — M62838 Other muscle spasm: Secondary | ICD-10-CM | POA: Diagnosis not present

## 2017-11-29 DIAGNOSIS — M546 Pain in thoracic spine: Secondary | ICD-10-CM | POA: Diagnosis not present

## 2017-11-29 DIAGNOSIS — M545 Low back pain: Secondary | ICD-10-CM | POA: Diagnosis not present

## 2017-12-03 DIAGNOSIS — G43109 Migraine with aura, not intractable, without status migrainosus: Secondary | ICD-10-CM | POA: Diagnosis not present

## 2017-12-03 DIAGNOSIS — I10 Essential (primary) hypertension: Secondary | ICD-10-CM | POA: Diagnosis not present

## 2017-12-03 DIAGNOSIS — E785 Hyperlipidemia, unspecified: Secondary | ICD-10-CM | POA: Diagnosis not present

## 2017-12-03 DIAGNOSIS — E790 Hyperuricemia without signs of inflammatory arthritis and tophaceous disease: Secondary | ICD-10-CM | POA: Diagnosis not present

## 2017-12-05 ENCOUNTER — Telehealth: Payer: Self-pay | Admitting: Gastroenterology

## 2017-12-05 DIAGNOSIS — Z79899 Other long term (current) drug therapy: Secondary | ICD-10-CM | POA: Diagnosis not present

## 2017-12-05 DIAGNOSIS — R079 Chest pain, unspecified: Secondary | ICD-10-CM | POA: Diagnosis not present

## 2017-12-05 DIAGNOSIS — G43909 Migraine, unspecified, not intractable, without status migrainosus: Secondary | ICD-10-CM | POA: Diagnosis not present

## 2017-12-05 DIAGNOSIS — R51 Headache: Secondary | ICD-10-CM | POA: Diagnosis not present

## 2017-12-05 DIAGNOSIS — R072 Precordial pain: Secondary | ICD-10-CM | POA: Diagnosis not present

## 2017-12-05 DIAGNOSIS — R002 Palpitations: Secondary | ICD-10-CM | POA: Diagnosis not present

## 2017-12-05 DIAGNOSIS — E785 Hyperlipidemia, unspecified: Secondary | ICD-10-CM | POA: Diagnosis not present

## 2017-12-05 DIAGNOSIS — E876 Hypokalemia: Secondary | ICD-10-CM | POA: Diagnosis not present

## 2017-12-05 DIAGNOSIS — I10 Essential (primary) hypertension: Secondary | ICD-10-CM | POA: Diagnosis not present

## 2017-12-05 DIAGNOSIS — E871 Hypo-osmolality and hyponatremia: Secondary | ICD-10-CM | POA: Diagnosis not present

## 2017-12-05 DIAGNOSIS — M3500 Sicca syndrome, unspecified: Secondary | ICD-10-CM | POA: Diagnosis not present

## 2017-12-05 DIAGNOSIS — E119 Type 2 diabetes mellitus without complications: Secondary | ICD-10-CM | POA: Diagnosis not present

## 2017-12-05 DIAGNOSIS — I160 Hypertensive urgency: Secondary | ICD-10-CM | POA: Diagnosis not present

## 2017-12-05 DIAGNOSIS — R0789 Other chest pain: Secondary | ICD-10-CM | POA: Diagnosis not present

## 2017-12-05 DIAGNOSIS — G4489 Other headache syndrome: Secondary | ICD-10-CM | POA: Diagnosis not present

## 2017-12-05 DIAGNOSIS — E039 Hypothyroidism, unspecified: Secondary | ICD-10-CM | POA: Diagnosis not present

## 2017-12-05 NOTE — Telephone Encounter (Signed)
Oncall Note: Patient called that she is having liquid stool with just liquid diet even before she started prep, wanted to know if she should do the prep and also BP was high at 150/100. She had just taken her hypertensive medication an hour earlier.  Reassured patient and advised her to continue prep as per instructions and take her BP medications.

## 2017-12-06 ENCOUNTER — Telehealth: Payer: Self-pay | Admitting: Gastroenterology

## 2017-12-06 ENCOUNTER — Encounter: Payer: BLUE CROSS/BLUE SHIELD | Admitting: Gastroenterology

## 2017-12-06 ENCOUNTER — Other Ambulatory Visit: Payer: Self-pay

## 2017-12-06 DIAGNOSIS — R079 Chest pain, unspecified: Secondary | ICD-10-CM | POA: Diagnosis not present

## 2017-12-06 DIAGNOSIS — R0789 Other chest pain: Secondary | ICD-10-CM | POA: Diagnosis not present

## 2017-12-06 DIAGNOSIS — E785 Hyperlipidemia, unspecified: Secondary | ICD-10-CM | POA: Diagnosis not present

## 2017-12-06 DIAGNOSIS — E119 Type 2 diabetes mellitus without complications: Secondary | ICD-10-CM | POA: Diagnosis not present

## 2017-12-06 DIAGNOSIS — I10 Essential (primary) hypertension: Secondary | ICD-10-CM | POA: Diagnosis not present

## 2017-12-06 DIAGNOSIS — R9431 Abnormal electrocardiogram [ECG] [EKG]: Secondary | ICD-10-CM | POA: Diagnosis not present

## 2017-12-06 DIAGNOSIS — R072 Precordial pain: Secondary | ICD-10-CM | POA: Diagnosis not present

## 2017-12-06 MED ORDER — SOD PICOSULFATE-MAG OX-CIT ACD 10-3.5-12 MG-GM -GM/160ML PO SOLN: 1.0000 | ORAL | 0 refills | Status: DC

## 2017-12-06 NOTE — Telephone Encounter (Signed)
I spoke with patient and rescheduled her colonoscopy for 01/03/18 at 8:30 am and her pre-op visit for 12/27/17 at 11:00 am.  New rx for [facility] sent to her pharmacy.

## 2017-12-07 DIAGNOSIS — R51 Headache: Secondary | ICD-10-CM | POA: Diagnosis not present

## 2017-12-07 DIAGNOSIS — M542 Cervicalgia: Secondary | ICD-10-CM | POA: Diagnosis not present

## 2017-12-07 DIAGNOSIS — M546 Pain in thoracic spine: Secondary | ICD-10-CM | POA: Diagnosis not present

## 2017-12-07 DIAGNOSIS — M62838 Other muscle spasm: Secondary | ICD-10-CM | POA: Diagnosis not present

## 2017-12-15 DIAGNOSIS — M62838 Other muscle spasm: Secondary | ICD-10-CM | POA: Diagnosis not present

## 2017-12-15 DIAGNOSIS — M545 Low back pain: Secondary | ICD-10-CM | POA: Diagnosis not present

## 2017-12-15 DIAGNOSIS — M546 Pain in thoracic spine: Secondary | ICD-10-CM | POA: Diagnosis not present

## 2017-12-15 DIAGNOSIS — R51 Headache: Secondary | ICD-10-CM | POA: Diagnosis not present

## 2017-12-17 DIAGNOSIS — Z79899 Other long term (current) drug therapy: Secondary | ICD-10-CM | POA: Diagnosis not present

## 2017-12-21 DIAGNOSIS — M542 Cervicalgia: Secondary | ICD-10-CM | POA: Diagnosis not present

## 2017-12-21 DIAGNOSIS — M62838 Other muscle spasm: Secondary | ICD-10-CM | POA: Diagnosis not present

## 2017-12-21 DIAGNOSIS — R201 Hypoesthesia of skin: Secondary | ICD-10-CM | POA: Diagnosis not present

## 2017-12-21 DIAGNOSIS — R51 Headache: Secondary | ICD-10-CM | POA: Diagnosis not present

## 2018-01-02 ENCOUNTER — Encounter: Payer: Self-pay | Admitting: Internal Medicine

## 2018-01-02 ENCOUNTER — Telehealth: Payer: Self-pay | Admitting: Internal Medicine

## 2018-01-02 NOTE — Telephone Encounter (Signed)
Having 8-10 watery stools a day for past 2 days or so BP is 160/100 No sick contacts No travel No recent Abx she says  She did have exposure to Splenda through teeth whitener rxed by a dentist prior to dental work and she has reactions to that and ? If that is it  She apologizes that this is second late cancel (BP issues last month)  I told her:  1) We will cancel colonoscopy for tomorrow - and will not charge her late cancel fee 2) If she is not sig better in AM see her PCP re: diarrhea for  Infectious work-up 3) Hydrate po, try loperamide  4) further plans per Dr. Lyndel Safe

## 2018-01-03 ENCOUNTER — Encounter: Payer: BLUE CROSS/BLUE SHIELD | Admitting: Gastroenterology

## 2018-01-04 DIAGNOSIS — K529 Noninfective gastroenteritis and colitis, unspecified: Secondary | ICD-10-CM | POA: Diagnosis not present

## 2018-01-04 NOTE — Telephone Encounter (Signed)
Thanks for letting me know!

## 2018-01-04 NOTE — Telephone Encounter (Signed)
Can you please call Coleta and see how she is doing. Had diarrhea She canceled  her colonoscopy

## 2018-01-04 NOTE — Telephone Encounter (Signed)
I spoke with her and she is feeling "better" but not quite 100%.  She wants to wait a month or 2 before rescheduling the colon.  I reminded her that she would need another pre-visit.  She is going to call the office when she is ready to schedule.

## 2018-01-05 DIAGNOSIS — G629 Polyneuropathy, unspecified: Secondary | ICD-10-CM | POA: Diagnosis not present

## 2018-01-05 DIAGNOSIS — R197 Diarrhea, unspecified: Secondary | ICD-10-CM | POA: Diagnosis not present

## 2018-01-05 DIAGNOSIS — Z79899 Other long term (current) drug therapy: Secondary | ICD-10-CM | POA: Diagnosis not present

## 2018-01-07 DIAGNOSIS — R197 Diarrhea, unspecified: Secondary | ICD-10-CM | POA: Diagnosis not present

## 2018-01-11 ENCOUNTER — Encounter: Payer: Self-pay | Admitting: Neurology

## 2018-01-12 DIAGNOSIS — M62838 Other muscle spasm: Secondary | ICD-10-CM | POA: Diagnosis not present

## 2018-01-12 DIAGNOSIS — M545 Low back pain: Secondary | ICD-10-CM | POA: Diagnosis not present

## 2018-01-12 DIAGNOSIS — M546 Pain in thoracic spine: Secondary | ICD-10-CM | POA: Diagnosis not present

## 2018-01-12 DIAGNOSIS — E790 Hyperuricemia without signs of inflammatory arthritis and tophaceous disease: Secondary | ICD-10-CM | POA: Diagnosis not present

## 2018-01-12 DIAGNOSIS — R51 Headache: Secondary | ICD-10-CM | POA: Diagnosis not present

## 2018-01-13 ENCOUNTER — Ambulatory Visit (INDEPENDENT_AMBULATORY_CARE_PROVIDER_SITE_OTHER): Payer: BLUE CROSS/BLUE SHIELD | Admitting: Neurology

## 2018-01-13 DIAGNOSIS — G43709 Chronic migraine without aura, not intractable, without status migrainosus: Secondary | ICD-10-CM

## 2018-01-13 DIAGNOSIS — G500 Trigeminal neuralgia: Secondary | ICD-10-CM

## 2018-01-14 NOTE — Progress Notes (Signed)
Nerve block w/o steroid: Pt signed consent  0.5% Bupivocaine 3 mL LOT: [id number] EXP: 02/2021  2% Lidocaine 3 mL LOT: [id number] EXP: 07/17/2019

## 2018-01-17 DIAGNOSIS — G43709 Chronic migraine without aura, not intractable, without status migrainosus: Secondary | ICD-10-CM | POA: Insufficient documentation

## 2018-01-17 NOTE — Progress Notes (Signed)
Patient with supraorbital neuralgia. Does extremely well with supraorbital nerve blocks.   Performed by Dr. Shalise Rosado M.D. All procedures a documented blood were medically necessary, reasonable and appropriate based on the patient's history, medical diagnosis and physician opinion. Verbal informed consent was obtained from the patient, patient was informed of potential risk of procedure, including bruising, bleeding, hematoma formation, infection, muscle weakness, muscle pain, numbness, transient hypertension, transient hyperglycemia and transient insomnia among others. All areas injected were topically clean with isopropyl rubbing alcohol. Nonsterile nonlatex gloves were worn during the procedure.  4. Supraorbital nerve block (64400): Supraorbital nerve site was identified along the incision of the frontal bone on the orbital/supraorbital ridge. Medication was injected into the left and right supraorbital nerve areas. Patient's condition is associated with inflammation of the supraorbital and associated muscle groups. Injection was deemed medically necessary, reasonable and appropriate. Injection represents a separate and unique surgical service.  

## 2018-01-20 ENCOUNTER — Other Ambulatory Visit: Payer: Self-pay | Admitting: Neurology

## 2018-01-20 MED ORDER — AMITRIPTYLINE HCL 10 MG PO TABS: 20.0000 mg | ORAL_TABLET | Freq: Every day | ORAL | 4 refills | Status: DC

## 2018-01-27 DIAGNOSIS — M62838 Other muscle spasm: Secondary | ICD-10-CM | POA: Diagnosis not present

## 2018-01-27 DIAGNOSIS — M546 Pain in thoracic spine: Secondary | ICD-10-CM | POA: Diagnosis not present

## 2018-01-27 DIAGNOSIS — M542 Cervicalgia: Secondary | ICD-10-CM | POA: Diagnosis not present

## 2018-01-27 DIAGNOSIS — M545 Low back pain: Secondary | ICD-10-CM | POA: Diagnosis not present

## 2018-02-04 DIAGNOSIS — N390 Urinary tract infection, site not specified: Secondary | ICD-10-CM | POA: Diagnosis not present

## 2018-02-10 DIAGNOSIS — M546 Pain in thoracic spine: Secondary | ICD-10-CM | POA: Diagnosis not present

## 2018-02-10 DIAGNOSIS — M62838 Other muscle spasm: Secondary | ICD-10-CM | POA: Diagnosis not present

## 2018-02-10 DIAGNOSIS — M545 Low back pain: Secondary | ICD-10-CM | POA: Diagnosis not present

## 2018-02-10 DIAGNOSIS — M461 Sacroiliitis, not elsewhere classified: Secondary | ICD-10-CM | POA: Diagnosis not present

## 2018-02-12 DIAGNOSIS — M62838 Other muscle spasm: Secondary | ICD-10-CM | POA: Diagnosis not present

## 2018-02-12 DIAGNOSIS — M546 Pain in thoracic spine: Secondary | ICD-10-CM | POA: Diagnosis not present

## 2018-02-12 DIAGNOSIS — M461 Sacroiliitis, not elsewhere classified: Secondary | ICD-10-CM | POA: Diagnosis not present

## 2018-02-12 DIAGNOSIS — M545 Low back pain: Secondary | ICD-10-CM | POA: Diagnosis not present

## 2018-02-15 ENCOUNTER — Ambulatory Visit (INDEPENDENT_AMBULATORY_CARE_PROVIDER_SITE_OTHER): Payer: BLUE CROSS/BLUE SHIELD | Admitting: Neurology

## 2018-02-15 ENCOUNTER — Encounter: Payer: Self-pay | Admitting: Neurology

## 2018-02-15 VITALS — BP 126/86 | HR 104

## 2018-02-15 DIAGNOSIS — G500 Trigeminal neuralgia: Secondary | ICD-10-CM | POA: Diagnosis not present

## 2018-02-15 NOTE — Progress Notes (Signed)
Patient with supraorbital neuralgia. Does extremely well with supraorbital nerve blocks.   Performed by Dr. Kailo Kosik M.D. All procedures a documented blood were medically necessary, reasonable and appropriate based on the patient's history, medical diagnosis and physician opinion. Verbal informed consent was obtained from the patient, patient was informed of potential risk of procedure, including bruising, bleeding, hematoma formation, infection, muscle weakness, muscle pain, numbness, transient hypertension, transient hyperglycemia and transient insomnia among others. All areas injected were topically clean with isopropyl rubbing alcohol. Nonsterile nonlatex gloves were worn during the procedure.  4. Supraorbital nerve block (64400): Supraorbital nerve site was identified along the incision of the frontal bone on the orbital/supraorbital ridge. Medication was injected into the left and right supraorbital nerve areas. Patient's condition is associated with inflammation of the supraorbital and associated muscle groups. Injection was deemed medically necessary, reasonable and appropriate. Injection represents a separate and unique surgical service.  

## 2018-02-15 NOTE — Progress Notes (Signed)
Nerve block w/o steroid: Pt signed consent  0.5% Bupivocaine 3 mL LOT: [id number] EXP: 02/2021  2% Lidocaine 3 mL LOT: [id number] EXP: 07/17/2019 //BCrn

## 2018-02-21 DIAGNOSIS — M62838 Other muscle spasm: Secondary | ICD-10-CM | POA: Diagnosis not present

## 2018-02-21 DIAGNOSIS — M542 Cervicalgia: Secondary | ICD-10-CM | POA: Diagnosis not present

## 2018-02-21 DIAGNOSIS — M545 Low back pain: Secondary | ICD-10-CM | POA: Diagnosis not present

## 2018-02-21 DIAGNOSIS — M546 Pain in thoracic spine: Secondary | ICD-10-CM | POA: Diagnosis not present

## 2018-03-08 DIAGNOSIS — M62838 Other muscle spasm: Secondary | ICD-10-CM | POA: Diagnosis not present

## 2018-03-08 DIAGNOSIS — M546 Pain in thoracic spine: Secondary | ICD-10-CM | POA: Diagnosis not present

## 2018-03-08 DIAGNOSIS — M542 Cervicalgia: Secondary | ICD-10-CM | POA: Diagnosis not present

## 2018-03-08 DIAGNOSIS — M461 Sacroiliitis, not elsewhere classified: Secondary | ICD-10-CM | POA: Diagnosis not present

## 2018-03-24 DIAGNOSIS — Z23 Encounter for immunization: Secondary | ICD-10-CM | POA: Diagnosis not present

## 2018-03-24 DIAGNOSIS — M546 Pain in thoracic spine: Secondary | ICD-10-CM | POA: Diagnosis not present

## 2018-03-24 DIAGNOSIS — M545 Low back pain: Secondary | ICD-10-CM | POA: Diagnosis not present

## 2018-03-24 DIAGNOSIS — R51 Headache: Secondary | ICD-10-CM | POA: Diagnosis not present

## 2018-03-24 DIAGNOSIS — M542 Cervicalgia: Secondary | ICD-10-CM | POA: Diagnosis not present

## 2018-03-28 ENCOUNTER — Ambulatory Visit: Payer: Self-pay | Admitting: Neurology

## 2018-04-04 ENCOUNTER — Ambulatory Visit (INDEPENDENT_AMBULATORY_CARE_PROVIDER_SITE_OTHER): Payer: BLUE CROSS/BLUE SHIELD | Admitting: Neurology

## 2018-04-04 DIAGNOSIS — G500 Trigeminal neuralgia: Secondary | ICD-10-CM

## 2018-04-04 NOTE — Progress Notes (Signed)
Nerve block w/o steroid: Pt signed consent  0.5% Bupivocaine 6 mL LOT: [id number] EXP: 06/2019 NDC: 03546-568-12  2% Lidocaine 6 mL LOT: [id number] EXP: 01/14/2019 NDC: 7517-0017-49

## 2018-04-04 NOTE — Progress Notes (Signed)
Patient with supraorbital neuralgia. Does extremely well with supraorbital nerve blocks.   Performed by Dr. Ahern M.D. All procedures a documented blood were medically necessary, reasonable and appropriate based on the patient's history, medical diagnosis and physician opinion. Verbal informed consent was obtained from the patient, patient was informed of potential risk of procedure, including bruising, bleeding, hematoma formation, infection, muscle weakness, muscle pain, numbness, transient hypertension, transient hyperglycemia and transient insomnia among others. All areas injected were topically clean with isopropyl rubbing alcohol. Nonsterile nonlatex gloves were worn during the procedure.  4. Supraorbital nerve block (64400): Supraorbital nerve site was identified along the incision of the frontal bone on the orbital/supraorbital ridge. Medication was injected into the left and right supraorbital nerve areas. Patient's condition is associated with inflammation of the supraorbital and associated muscle groups. Injection was deemed medically necessary, reasonable and appropriate. Injection represents a separate and unique surgical service.  

## 2018-04-06 DIAGNOSIS — M546 Pain in thoracic spine: Secondary | ICD-10-CM | POA: Diagnosis not present

## 2018-04-06 DIAGNOSIS — M542 Cervicalgia: Secondary | ICD-10-CM | POA: Diagnosis not present

## 2018-04-06 DIAGNOSIS — M545 Low back pain: Secondary | ICD-10-CM | POA: Diagnosis not present

## 2018-04-06 DIAGNOSIS — R51 Headache: Secondary | ICD-10-CM | POA: Diagnosis not present

## 2018-04-08 DIAGNOSIS — E039 Hypothyroidism, unspecified: Secondary | ICD-10-CM | POA: Diagnosis not present

## 2018-04-08 DIAGNOSIS — Z79899 Other long term (current) drug therapy: Secondary | ICD-10-CM | POA: Diagnosis not present

## 2018-04-08 DIAGNOSIS — R7301 Impaired fasting glucose: Secondary | ICD-10-CM | POA: Diagnosis not present

## 2018-04-08 DIAGNOSIS — E790 Hyperuricemia without signs of inflammatory arthritis and tophaceous disease: Secondary | ICD-10-CM | POA: Diagnosis not present

## 2018-04-08 DIAGNOSIS — E785 Hyperlipidemia, unspecified: Secondary | ICD-10-CM | POA: Diagnosis not present

## 2018-04-12 DIAGNOSIS — M546 Pain in thoracic spine: Secondary | ICD-10-CM | POA: Diagnosis not present

## 2018-04-15 DIAGNOSIS — I10 Essential (primary) hypertension: Secondary | ICD-10-CM | POA: Diagnosis not present

## 2018-04-15 DIAGNOSIS — E039 Hypothyroidism, unspecified: Secondary | ICD-10-CM | POA: Diagnosis not present

## 2018-04-15 DIAGNOSIS — G43109 Migraine with aura, not intractable, without status migrainosus: Secondary | ICD-10-CM | POA: Diagnosis not present

## 2018-04-15 DIAGNOSIS — R7301 Impaired fasting glucose: Secondary | ICD-10-CM | POA: Diagnosis not present

## 2018-04-21 DIAGNOSIS — M545 Low back pain: Secondary | ICD-10-CM | POA: Diagnosis not present

## 2018-04-21 DIAGNOSIS — M542 Cervicalgia: Secondary | ICD-10-CM | POA: Diagnosis not present

## 2018-04-21 DIAGNOSIS — M62838 Other muscle spasm: Secondary | ICD-10-CM | POA: Diagnosis not present

## 2018-04-21 DIAGNOSIS — M546 Pain in thoracic spine: Secondary | ICD-10-CM | POA: Diagnosis not present

## 2018-04-29 DIAGNOSIS — S91102A Unspecified open wound of left great toe without damage to nail, initial encounter: Secondary | ICD-10-CM | POA: Diagnosis not present

## 2018-04-29 DIAGNOSIS — M546 Pain in thoracic spine: Secondary | ICD-10-CM | POA: Diagnosis not present

## 2018-05-03 DIAGNOSIS — M5124 Other intervertebral disc displacement, thoracic region: Secondary | ICD-10-CM | POA: Diagnosis not present

## 2018-05-03 DIAGNOSIS — M62838 Other muscle spasm: Secondary | ICD-10-CM | POA: Diagnosis not present

## 2018-05-03 DIAGNOSIS — M542 Cervicalgia: Secondary | ICD-10-CM | POA: Diagnosis not present

## 2018-05-03 DIAGNOSIS — M546 Pain in thoracic spine: Secondary | ICD-10-CM | POA: Diagnosis not present

## 2018-05-05 NOTE — Telephone Encounter (Signed)
Looks like 12/9, 12/10 at 1600. Are any of these ok?  (you have listed for botox). Let me know and I will schedule.

## 2018-05-17 DIAGNOSIS — M546 Pain in thoracic spine: Secondary | ICD-10-CM | POA: Diagnosis not present

## 2018-05-17 DIAGNOSIS — M5124 Other intervertebral disc displacement, thoracic region: Secondary | ICD-10-CM | POA: Diagnosis not present

## 2018-05-17 DIAGNOSIS — M542 Cervicalgia: Secondary | ICD-10-CM | POA: Diagnosis not present

## 2018-05-17 DIAGNOSIS — M62838 Other muscle spasm: Secondary | ICD-10-CM | POA: Diagnosis not present

## 2018-05-23 ENCOUNTER — Ambulatory Visit (INDEPENDENT_AMBULATORY_CARE_PROVIDER_SITE_OTHER): Payer: BLUE CROSS/BLUE SHIELD | Admitting: Neurology

## 2018-05-23 DIAGNOSIS — G500 Trigeminal neuralgia: Secondary | ICD-10-CM | POA: Diagnosis not present

## 2018-05-23 NOTE — Progress Notes (Signed)
Nerve block w/o steroid: Pt signed consent  0.5% Bupivocaine 4.5 mL LOT: [id number] EXP: [id number] NDC: 27078-675-44  2% Lidocaine 4.5 mL LOT: [id number] EXP: 09/22 [place]: 12197-588-32

## 2018-05-23 NOTE — Progress Notes (Signed)
Patient with supraorbital neuralgia. Does extremely well with supraorbital nerve blocks.   Performed by Dr. Ahern M.D. All procedures a documented blood were medically necessary, reasonable and appropriate based on the patient's history, medical diagnosis and physician opinion. Verbal informed consent was obtained from the patient, patient was informed of potential risk of procedure, including bruising, bleeding, hematoma formation, infection, muscle weakness, muscle pain, numbness, transient hypertension, transient hyperglycemia and transient insomnia among others. All areas injected were topically clean with isopropyl rubbing alcohol. Nonsterile nonlatex gloves were worn during the procedure.  4. Supraorbital nerve block (64400): Supraorbital nerve site was identified along the incision of the frontal bone on the orbital/supraorbital ridge. Medication was injected into the left and right supraorbital nerve areas. Patient's condition is associated with inflammation of the supraorbital and associated muscle groups. Injection was deemed medically necessary, reasonable and appropriate. Injection represents a separate and unique surgical service.  

## 2018-05-25 IMAGING — CT CT ANGIO NECK
1 of 5 series · 3 of 16 positions shown · IV contrast (APPLIED)
Comparison: Carotid ultrasound 03/14/2013

CLINICAL DATA: Left carotid bruit.  Carotid stenosis follow-up.

Creatinine was obtained on site at [HOSPITAL] at [HOSPITAL].
Results: Creatinine 0.7 mg/dL.
EXAM:
CT ANGIOGRAPHY HEAD AND NECK
TECHNIQUE: Multidetector CT imaging of the head and neck was performed using
the standard protocol during bolus administration of intravenous
contrast. Multiplanar CT image reconstructions and MIPs were
obtained to evaluate the vascular anatomy. Carotid stenosis
measurements (when applicable) are obtained utilizing NASCET
criteria, using the distal internal carotid diameter as the
denominator.
CONTRAST:  75mL [id number] IOPAMIDOL (NGXH66-W16) INJECTION 76%

[Series 8: head/neck angio · axial · 0.51mm/px · z∈[-256,+86]mm · 3 of 115 slices shown]
[im 1/115  soft-tissue]
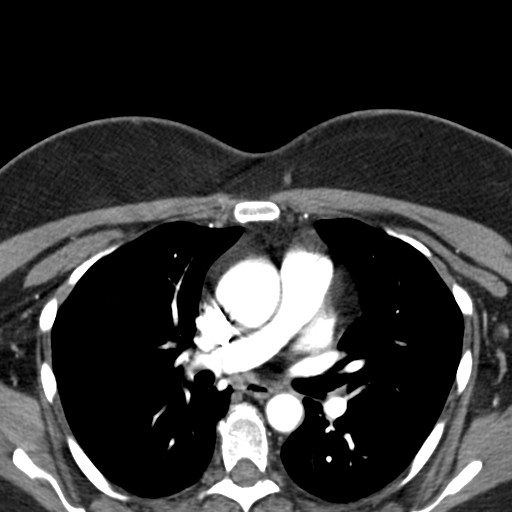
[im 58/115  bone]
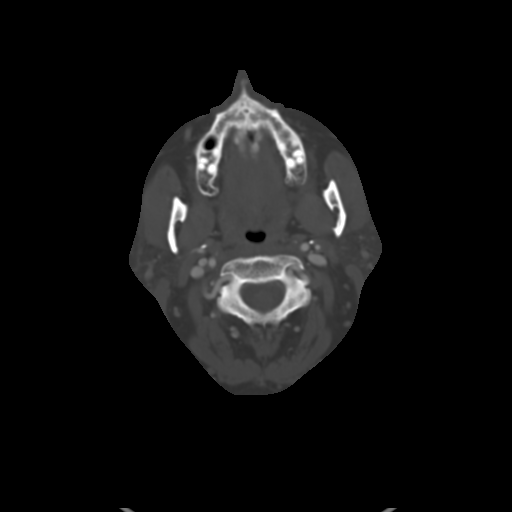
[im 115/115  soft-tissue]
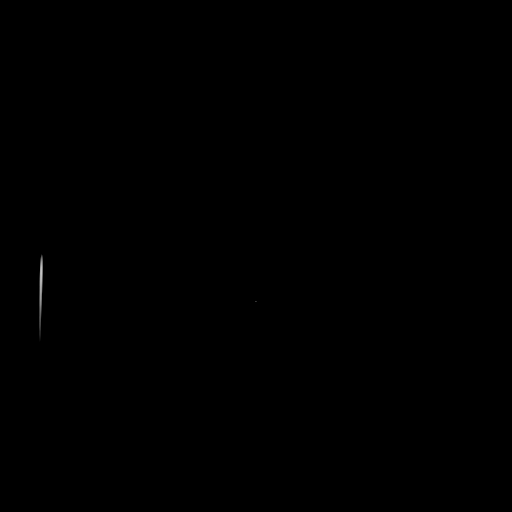

[3 of 16 positions shown; findings below may reference images not displayed]

FINDINGS: CT HEAD FINDINGS

Brain: Ventricle size normal. Cerebral volume within normal limits.
Negative for acute infarct, hemorrhage, or mass lesion.

Vascular: Negative for hyperdense vessel

Skull: Negative

Sinuses: Negative

Orbits: Negative

Review of the MIP images confirms the above findings

CTA NECK FINDINGS

Aortic arch: Normal aortic arch. Aberrant right subclavian artery
with retroesophageal course. Variant anatomy.

Right carotid system: Mild atherosclerotic plaque at the right
carotid bifurcation extending into the carotid bulb. Minimal luminal
diameter internal carotid artery 2.4 mm, corresponding to 35%
diameter stenosis. No dissection

Left carotid system: Left carotid bifurcation has minimal
atherosclerotic disease without stenosis or dissection

Vertebral arteries: Both vertebral arteries widely patent and normal

Skeleton: Negative

Other neck: Negative

Upper chest: Negative

Review of the MIP images confirms the above findings

CTA HEAD FINDINGS

Anterior circulation: Cavernous carotid normal bilaterally. Anterior
and middle cerebral arteries normal bilaterally without stenosis or
large vessel occlusion

Posterior circulation: Both vertebral arteries patent to the
basilar. Basilar normal. PICA, superior cerebellar, and posterior
cerebral arteries patent bilaterally. Fetal origin left posterior
cerebral artery.

Venous sinuses: Patent

Anatomic variants: None

Delayed phase: Normal enhancement on delayed imaging.

Review of the MIP images confirms the above findings
IMPRESSION: 35% diameter stenosis proximal right internal carotid artery due to
atherosclerotic disease. Left carotid artery widely patent without
stenosis. Both vertebral arteries widely patent. Negative for
dissection

No significant intracranial stenosis

Advair right subclavian artery, variant anatomy

## 2018-05-30 DIAGNOSIS — M545 Low back pain: Secondary | ICD-10-CM | POA: Diagnosis not present

## 2018-05-30 DIAGNOSIS — M5124 Other intervertebral disc displacement, thoracic region: Secondary | ICD-10-CM | POA: Diagnosis not present

## 2018-05-30 DIAGNOSIS — M546 Pain in thoracic spine: Secondary | ICD-10-CM | POA: Diagnosis not present

## 2018-05-30 DIAGNOSIS — M62838 Other muscle spasm: Secondary | ICD-10-CM | POA: Diagnosis not present

## 2018-06-01 DIAGNOSIS — E039 Hypothyroidism, unspecified: Secondary | ICD-10-CM | POA: Diagnosis not present

## 2018-06-14 DIAGNOSIS — M62838 Other muscle spasm: Secondary | ICD-10-CM | POA: Diagnosis not present

## 2018-06-14 DIAGNOSIS — M5124 Other intervertebral disc displacement, thoracic region: Secondary | ICD-10-CM | POA: Diagnosis not present

## 2018-06-14 DIAGNOSIS — M546 Pain in thoracic spine: Secondary | ICD-10-CM | POA: Diagnosis not present

## 2018-06-14 DIAGNOSIS — M542 Cervicalgia: Secondary | ICD-10-CM | POA: Diagnosis not present

## 2018-06-20 ENCOUNTER — Telehealth: Payer: Self-pay | Admitting: Neurology

## 2018-06-20 NOTE — Telephone Encounter (Signed)
Pt reached out on my chart and is wanting to be seen perferably sooner then jan 23 for nerve blocks. Patient has apt on 23rd for EMG/NCV.  I replied to the patient and advised that I would discuss with Dr Jaynee Eagles to see where the patient can be worked in if she in fact would like to perform the nerve block on the patient. Advised the patient I will contact her and get the pt scheduled based off what Dr Jaynee Eagles or Romelle Starcher would like to offer the patient.

## 2018-06-21 NOTE — Telephone Encounter (Signed)
Emailed pt through [employer]. Will await her response there.

## 2018-06-22 NOTE — Telephone Encounter (Signed)
I can also make a 430 appointment for her on a Tuesday or Thursday thanks

## 2018-06-23 DIAGNOSIS — M546 Pain in thoracic spine: Secondary | ICD-10-CM | POA: Diagnosis not present

## 2018-06-23 DIAGNOSIS — M5412 Radiculopathy, cervical region: Secondary | ICD-10-CM | POA: Diagnosis not present

## 2018-06-23 DIAGNOSIS — M62838 Other muscle spasm: Secondary | ICD-10-CM | POA: Diagnosis not present

## 2018-06-23 DIAGNOSIS — M545 Low back pain: Secondary | ICD-10-CM | POA: Diagnosis not present

## 2018-06-28 DIAGNOSIS — M25532 Pain in left wrist: Secondary | ICD-10-CM | POA: Diagnosis not present

## 2018-06-28 DIAGNOSIS — M542 Cervicalgia: Secondary | ICD-10-CM | POA: Diagnosis not present

## 2018-06-28 DIAGNOSIS — M25531 Pain in right wrist: Secondary | ICD-10-CM | POA: Diagnosis not present

## 2018-06-28 DIAGNOSIS — M545 Low back pain: Secondary | ICD-10-CM | POA: Diagnosis not present

## 2018-06-30 DIAGNOSIS — Z79899 Other long term (current) drug therapy: Secondary | ICD-10-CM | POA: Diagnosis not present

## 2018-07-05 DIAGNOSIS — Z1231 Encounter for screening mammogram for malignant neoplasm of breast: Secondary | ICD-10-CM | POA: Diagnosis not present

## 2018-07-06 DIAGNOSIS — R7301 Impaired fasting glucose: Secondary | ICD-10-CM | POA: Diagnosis not present

## 2018-07-07 ENCOUNTER — Ambulatory Visit: Payer: BLUE CROSS/BLUE SHIELD | Admitting: Neurology

## 2018-07-07 ENCOUNTER — Ambulatory Visit (INDEPENDENT_AMBULATORY_CARE_PROVIDER_SITE_OTHER): Payer: BLUE CROSS/BLUE SHIELD | Admitting: Neurology

## 2018-07-07 DIAGNOSIS — Z0289 Encounter for other administrative examinations: Secondary | ICD-10-CM

## 2018-07-07 DIAGNOSIS — G500 Trigeminal neuralgia: Secondary | ICD-10-CM | POA: Diagnosis not present

## 2018-07-07 DIAGNOSIS — G5603 Carpal tunnel syndrome, bilateral upper limbs: Secondary | ICD-10-CM

## 2018-07-07 DIAGNOSIS — M5442 Lumbago with sciatica, left side: Secondary | ICD-10-CM

## 2018-07-07 DIAGNOSIS — M5441 Lumbago with sciatica, right side: Secondary | ICD-10-CM

## 2018-07-07 DIAGNOSIS — R202 Paresthesia of skin: Secondary | ICD-10-CM

## 2018-07-07 DIAGNOSIS — M545 Low back pain: Secondary | ICD-10-CM

## 2018-07-07 DIAGNOSIS — G8929 Other chronic pain: Secondary | ICD-10-CM

## 2018-07-07 NOTE — Progress Notes (Signed)
Nerve block w/o steroid: Pt signed consent  0.5% Bupivocaine 3 mL LOT: [id number] EXP: 11/14/2019 NDC: 5361-4431-54  2% Lidocaine 3 mL LOT: [id number] EXP: 01/14/2019 NDC: 0086-7619-50  Patient with supraorbital neuralgia. Does extremely well with supraorbital nerve blocks.  Performed by Dr. Jaynee Eagles M.D. All procedures a documented blood were medically necessary, reasonable and appropriate based on the patient's history, medical diagnosis and physician opinion. Verbal informed consent was obtained from the patient, patient was informed of potential risk of procedure, including bruising, bleeding, hematoma formation, infection, muscle weakness, muscle pain, numbness, transient hypertension, transient hyperglycemia and transient insomnia among others. All areas injected were topically clean with isopropyl rubbing alcohol. Nonsterile nonlatex gloves were worn during the procedure.  4. Supraorbital nerve block (64400): Supraorbital nerve site was identified along the incision of the frontal bone on the orbital/supraorbital ridge. Medication was injected into the left and right supraorbital nerve areas. Patient's condition is associated with inflammation of the supraorbital and associated muscle groups. Injection was deemed medically necessary, reasonable and appropriate. Injection represents a separate and unique surgical service.

## 2018-07-10 NOTE — Progress Notes (Signed)
See procedure note.

## 2018-07-10 NOTE — Progress Notes (Signed)
Full Name: Chloe Pearson Gender: Female MRN #: [id number] Date of Birth: 12-Nov-1965    Visit Date: 07/07/2018 10:51 Age: 53 Years 29 Months Old Examining Physician: Sarina Ill, MD  Referring Physician: Ernestene Kiel, MD  History: Patient with sensory changes in the bilateral hands. She wakes up in the middle of night and in the morning with numbness in her hands that sometime radiate up the arm. No associated neck pain or cervical radicular symptoms. She also has low back pain and right leg symptoms.   Summary: Nerve Conduction Studies were performed on the bilateral upper and right lower extremities.  The right Median 2nd Digit orthodromic sensory nerve showed borderline distal peak latency (3.4 ms, N<3.4). The left  Median 2nd Digit orthodromic sensory nerve showed prolonged distal peak latency (3.4 ms, N<3.4). The left median/ulnar (palm) comparison nerve showed prolonged distal peak latency (Median Palm, 2.4 ms, N<2.2) and abnormal peak latency difference (Median Palm-Ulnar Palm, 0.4 ms, N<0.4) with a relative median delay.  The right median/ulnar (palm) comparison nerve showed prolonged distal peak latency (Median Palm, 2.4 ms, N<2.2) and abnormal peak latency difference (Median Palm-Ulnar Palm, 0.4 ms, N<0.4) with a relative median delay. All remaining nerves (as indicated in the following tables) were within normal limits. All muscles (as indicated in the following tables) were within normal limits    Conclusion: There is electrophysiologic evidence of bilateral mild Carpal Tunnel Syndrome.  No suggestion of polyneuropathy or radiculopathy. EMG of the right lower extremity was normal recommend MRI of the lumbar spine.  Sarina Ill M.D.  [facility] [place], [place] [address] Tel: [phone] Fax: [phone]        [facility]    Nerve / Sites Muscle Latency Ref. Amplitude Ref. Rel Amp Segments Distance Velocity Ref. Area    ms ms mV mV %  cm  m/s m/s mVms  R Median - APB     Wrist APB 4.1 ?4.4 7.3 ?4.0 100 Wrist - APB 7   29.7     Upper arm APB 7.3  6.8  93.7 Upper arm - Wrist 18 55 ?49 32.1  R Ulnar - ADM     Wrist ADM 3.1 ?3.3 7.1 ?6.0 100 Wrist - ADM 7   29.6     B.Elbow ADM 5.4  7.7  110 B.Elbow - Wrist 14 63 ?49 33.1     A.Elbow ADM 6.8  7.8  100 A.Elbow - B.Elbow 10 69 ?49 33.3         A.Elbow - Wrist      R Peroneal - EDB     Ankle EDB 4.9 ?6.5 8.6 ?2.0 100 Ankle - EDB 9   31.6     Fib head EDB 10.9  7.8  90.9 Fib head - Ankle 29 48 ?44 30.9     Pop fossa EDB 13.1  7.4  94.2 Pop fossa - Fib head 10 45 ?44 29.5         Pop fossa - Ankle      R Tibial - AH     Ankle AH 5.4 ?5.8 9.3 ?4.0 100 Ankle - AH 9   29.6     Pop fossa AH 14.0  8.4  90.6 Pop fossa - Ankle 35 41 ?41 30.8             SNC    Nerve / Sites Rec. Site Peak Lat Ref.  Amp Ref. Segments Distance Peak Diff Ref.  ms ms V V  cm ms ms  R Radial - Anatomical snuff box (Forearm)     Forearm Wrist 2.8 ?2.9 22 ?15 Forearm - Wrist 10    L Radial - Anatomical snuff box (Forearm)     Forearm Wrist 2.6 ?2.9 24 ?15 Forearm - Wrist 10    R Sural - Ankle (Calf)     Calf Ankle 3.3 ?4.4 28 ?6 Calf - Ankle 14    R Superficial peroneal - Ankle     Lat leg Ankle 4.2 ?4.4 11 ?6 Lat leg - Ankle 14    L Median, Ulnar - Transcarpal comparison     Median Palm Wrist 2.4 ?2.2 66 ?35 Median Palm - Wrist 8       Ulnar Palm Wrist 2.1 ?2.2 13 ?12 Ulnar Palm - Wrist 8          Median Palm - Ulnar Palm  0.4 ?0.4  R Median - Orthodromic (Dig II, Mid palm)     Dig II Wrist 3.4 ?3.4 26 ?10 Dig II - Wrist 13    L Median - Orthodromic (Dig II, Mid palm)     Dig II Wrist 3.4 ?3.4 19 ?10 Dig II - Wrist 13    R Ulnar - Orthodromic, (Dig V, Mid palm)     Dig V Wrist 2.8 ?3.1 9 ?5 Dig V - Wrist 11    L Ulnar - Orthodromic, (Dig V, Mid palm)     Dig V Wrist 2.9 ?3.1 8 ?5 Dig V - Wrist 50                          F  Wave    Nerve F Lat Ref.   ms ms  R Tibial - AH 51.3 ?56.0  R  Ulnar - ADM 25.2 ?32.0         H Reflex    Nerve H Lat    ms    Left Right Ref.     Tibial - Soleus 33.3 31.5 ?35.0            EMG full       EMG Summary Table    Spontaneous MUAP Recruitment  Muscle IA Fib PSW Fasc Other Amp Dur. Poly Pattern  R. Vastus medialis Normal None None None _______ Normal Normal Normal Normal  R. Tibialis anterior Normal None None None _______ Normal Normal Normal Normal  R. Gastrocnemius (Medial head) Normal None None None _______ Normal Normal Normal Normal  R. Extensor hallucis longus Normal None None None _______ Normal Normal Normal Normal  R. Biceps femoris (long head) Normal None None None _______ Normal Normal Normal Normal  R. Gluteus maximus Normal None None None _______ Normal Normal Normal Normal  R. Gluteus medius Normal None None None _______ Normal Normal Normal Normal  R. Lumbar paraspinals (low) Normal None None None _______ Normal Normal Normal Normal     EMG full       EMG Summary Table    Spontaneous MUAP Recruitment  Muscle IA Fib PSW Fasc Other Amp Dur. Poly Pattern  L. Deltoid Normal None None None _______ Normal Normal Normal Normal  L. Triceps brachii Normal None None None _______ Normal Normal Normal Normal  R. Deltoid Normal None None None _______ Normal Normal Normal Normal  R. Triceps brachii Normal None None None _______ Normal Normal Normal Normal  L. Pronator teres Normal None None None _______ Normal Normal Normal Normal  R. Pronator teres Normal None None None _______ Normal Normal Normal Normal  L. First dorsal interosseous Normal None None None _______ Normal Normal Normal Normal  R. First dorsal interosseous Normal None None None _______ Normal Normal Normal Normal  L. Opponens pollicis Normal None None None _______ Normal Normal Normal Normal

## 2018-07-11 NOTE — Procedures (Signed)
Full Name: Chloe Pearson Gender: Female MRN #: [id number] Date of Birth: 1965/09/21    Visit Date: 07/07/2018 10:51 Age: 53 Years 13 Months Old Examining Physician: Sarina Ill, MD  Referring Physician: Ernestene Kiel, MD  History: Patient with sensory changes in the bilateral hands. She wakes up in the middle of night and in the morning with numbness in her hands that sometime radiate up the arm. No associated neck pain or cervical radicular symptoms. She also has low back pain and right leg symptoms.   Summary: Nerve Conduction Studies were performed on the bilateral upper and right lower extremities.  The right Median 2nd Digit orthodromic sensory nerve showed borderline distal peak latency (3.4 ms, N<3.4). The left  Median 2nd Digit orthodromic sensory nerve showed prolonged distal peak latency (3.4 ms, N<3.4). The left median/ulnar (palm) comparison nerve showed prolonged distal peak latency (Median Palm, 2.4 ms, N<2.2) and abnormal peak latency difference (Median Palm-Ulnar Palm, 0.4 ms, N<0.4) with a relative median delay.  The right median/ulnar (palm) comparison nerve showed prolonged distal peak latency (Median Palm, 2.4 ms, N<2.2) and abnormal peak latency difference (Median Palm-Ulnar Palm, 0.4 ms, N<0.4) with a relative median delay. All remaining nerves (as indicated in the following tables) were within normal limits. All muscles (as indicated in the following tables) were within normal limits    Conclusion: There is electrophysiologic evidence of bilateral mild Carpal Tunnel Syndrome.  No suggestion of polyneuropathy or radiculopathy. EMG of the right lower extremity was normal recommend MRI of the lumbar spine.  Sarina Ill M.D.  [facility] [place], [place] [address] Tel: [phone] Fax: [phone]        [facility]    Nerve / Sites Muscle Latency Ref. Amplitude Ref. Rel Amp Segments Distance Velocity Ref. Area    ms ms mV mV %  cm  m/s m/s mVms  R Median - APB     Wrist APB 4.1 ?4.4 7.3 ?4.0 100 Wrist - APB 7   29.7     Upper arm APB 7.3  6.8  93.7 Upper arm - Wrist 18 55 ?49 32.1  R Ulnar - ADM     Wrist ADM 3.1 ?3.3 7.1 ?6.0 100 Wrist - ADM 7   29.6     B.Elbow ADM 5.4  7.7  110 B.Elbow - Wrist 14 63 ?49 33.1     A.Elbow ADM 6.8  7.8  100 A.Elbow - B.Elbow 10 69 ?49 33.3         A.Elbow - Wrist      R Peroneal - EDB     Ankle EDB 4.9 ?6.5 8.6 ?2.0 100 Ankle - EDB 9   31.6     Fib head EDB 10.9  7.8  90.9 Fib head - Ankle 29 48 ?44 30.9     Pop fossa EDB 13.1  7.4  94.2 Pop fossa - Fib head 10 45 ?44 29.5         Pop fossa - Ankle      R Tibial - AH     Ankle AH 5.4 ?5.8 9.3 ?4.0 100 Ankle - AH 9   29.6     Pop fossa AH 14.0  8.4  90.6 Pop fossa - Ankle 35 41 ?41 30.8             SNC    Nerve / Sites Rec. Site Peak Lat Ref.  Amp Ref. Segments Distance Peak Diff Ref.  ms ms V V  cm ms ms  R Radial - Anatomical snuff box (Forearm)     Forearm Wrist 2.8 ?2.9 22 ?15 Forearm - Wrist 10    L Radial - Anatomical snuff box (Forearm)     Forearm Wrist 2.6 ?2.9 24 ?15 Forearm - Wrist 10    R Sural - Ankle (Calf)     Calf Ankle 3.3 ?4.4 28 ?6 Calf - Ankle 14    R Superficial peroneal - Ankle     Lat leg Ankle 4.2 ?4.4 11 ?6 Lat leg - Ankle 14    L Median, Ulnar - Transcarpal comparison     Median Palm Wrist 2.4 ?2.2 66 ?35 Median Palm - Wrist 8       Ulnar Palm Wrist 2.1 ?2.2 13 ?12 Ulnar Palm - Wrist 8          Median Palm - Ulnar Palm  0.4 ?0.4  R Median - Orthodromic (Dig II, Mid palm)     Dig II Wrist 3.4 ?3.4 26 ?10 Dig II - Wrist 13    L Median - Orthodromic (Dig II, Mid palm)     Dig II Wrist 3.4 ?3.4 19 ?10 Dig II - Wrist 13    R Ulnar - Orthodromic, (Dig V, Mid palm)     Dig V Wrist 2.8 ?3.1 9 ?5 Dig V - Wrist 11    L Ulnar - Orthodromic, (Dig V, Mid palm)     Dig V Wrist 2.9 ?3.1 8 ?5 Dig V - Wrist 92                          F  Wave    Nerve F Lat Ref.   ms ms  R Tibial - AH 51.3 ?56.0  R  Ulnar - ADM 25.2 ?32.0         H Reflex    Nerve H Lat    ms    Left Right Ref.     Tibial - Soleus 33.3 31.5 ?35.0            EMG full       EMG Summary Table    Spontaneous MUAP Recruitment  Muscle IA Fib PSW Fasc Other Amp Dur. Poly Pattern  R. Vastus medialis Normal None None None _______ Normal Normal Normal Normal  R. Tibialis anterior Normal None None None _______ Normal Normal Normal Normal  R. Gastrocnemius (Medial head) Normal None None None _______ Normal Normal Normal Normal  R. Extensor hallucis longus Normal None None None _______ Normal Normal Normal Normal  R. Biceps femoris (long head) Normal None None None _______ Normal Normal Normal Normal  R. Gluteus maximus Normal None None None _______ Normal Normal Normal Normal  R. Gluteus medius Normal None None None _______ Normal Normal Normal Normal  R. Lumbar paraspinals (low) Normal None None None _______ Normal Normal Normal Normal     EMG full       EMG Summary Table    Spontaneous MUAP Recruitment  Muscle IA Fib PSW Fasc Other Amp Dur. Poly Pattern  L. Deltoid Normal None None None _______ Normal Normal Normal Normal  L. Triceps brachii Normal None None None _______ Normal Normal Normal Normal  R. Deltoid Normal None None None _______ Normal Normal Normal Normal  R. Triceps brachii Normal None None None _______ Normal Normal Normal Normal  L. Pronator teres Normal None None None _______ Normal Normal Normal Normal  R. Pronator teres Normal None None None _______ Normal Normal Normal Normal  L. First dorsal interosseous Normal None None None _______ Normal Normal Normal Normal  R. First dorsal interosseous Normal None None None _______ Normal Normal Normal Normal  L. Opponens pollicis Normal None None None _______ Normal Normal Normal Normal

## 2018-07-13 ENCOUNTER — Telehealth: Payer: Self-pay | Admitting: Neurology

## 2018-07-13 DIAGNOSIS — M542 Cervicalgia: Secondary | ICD-10-CM | POA: Diagnosis not present

## 2018-07-13 DIAGNOSIS — M25532 Pain in left wrist: Secondary | ICD-10-CM | POA: Diagnosis not present

## 2018-07-13 DIAGNOSIS — M545 Low back pain: Secondary | ICD-10-CM | POA: Diagnosis not present

## 2018-07-13 DIAGNOSIS — M25531 Pain in right wrist: Secondary | ICD-10-CM | POA: Diagnosis not present

## 2018-07-13 NOTE — Telephone Encounter (Signed)
MR Lumbar spine wo contrast Dr. Ihor Dow Auth: [facility] Ref# [id number]. Patient is scheduled at [facility] for 07/20/18

## 2018-07-17 DIAGNOSIS — J0100 Acute maxillary sinusitis, unspecified: Secondary | ICD-10-CM | POA: Diagnosis not present

## 2018-07-20 ENCOUNTER — Ambulatory Visit (INDEPENDENT_AMBULATORY_CARE_PROVIDER_SITE_OTHER): Payer: BLUE CROSS/BLUE SHIELD

## 2018-07-20 DIAGNOSIS — G8929 Other chronic pain: Secondary | ICD-10-CM

## 2018-07-20 DIAGNOSIS — M5442 Lumbago with sciatica, left side: Secondary | ICD-10-CM | POA: Diagnosis not present

## 2018-07-20 DIAGNOSIS — G5603 Carpal tunnel syndrome, bilateral upper limbs: Secondary | ICD-10-CM

## 2018-07-20 DIAGNOSIS — M5441 Lumbago with sciatica, right side: Secondary | ICD-10-CM

## 2018-07-25 DIAGNOSIS — Z6828 Body mass index (BMI) 28.0-28.9, adult: Secondary | ICD-10-CM | POA: Diagnosis not present

## 2018-07-25 DIAGNOSIS — J0120 Acute ethmoidal sinusitis, unspecified: Secondary | ICD-10-CM | POA: Diagnosis not present

## 2018-07-25 DIAGNOSIS — E663 Overweight: Secondary | ICD-10-CM | POA: Diagnosis not present

## 2018-07-28 DIAGNOSIS — M542 Cervicalgia: Secondary | ICD-10-CM | POA: Diagnosis not present

## 2018-07-28 DIAGNOSIS — M26622 Arthralgia of left temporomandibular joint: Secondary | ICD-10-CM | POA: Diagnosis not present

## 2018-07-28 DIAGNOSIS — M62838 Other muscle spasm: Secondary | ICD-10-CM | POA: Diagnosis not present

## 2018-07-28 DIAGNOSIS — M5124 Other intervertebral disc displacement, thoracic region: Secondary | ICD-10-CM | POA: Diagnosis not present

## 2018-08-03 DIAGNOSIS — I781 Nevus, non-neoplastic: Secondary | ICD-10-CM | POA: Diagnosis not present

## 2018-08-03 DIAGNOSIS — L719 Rosacea, unspecified: Secondary | ICD-10-CM | POA: Diagnosis not present

## 2018-08-03 DIAGNOSIS — L578 Other skin changes due to chronic exposure to nonionizing radiation: Secondary | ICD-10-CM | POA: Diagnosis not present

## 2018-08-09 DIAGNOSIS — L578 Other skin changes due to chronic exposure to nonionizing radiation: Secondary | ICD-10-CM | POA: Diagnosis not present

## 2018-08-09 DIAGNOSIS — E161 Other hypoglycemia: Secondary | ICD-10-CM | POA: Diagnosis not present

## 2018-08-09 DIAGNOSIS — I781 Nevus, non-neoplastic: Secondary | ICD-10-CM | POA: Diagnosis not present

## 2018-08-09 DIAGNOSIS — R7301 Impaired fasting glucose: Secondary | ICD-10-CM | POA: Diagnosis not present

## 2018-08-10 DIAGNOSIS — M5124 Other intervertebral disc displacement, thoracic region: Secondary | ICD-10-CM | POA: Diagnosis not present

## 2018-08-10 DIAGNOSIS — M545 Low back pain: Secondary | ICD-10-CM | POA: Diagnosis not present

## 2018-08-10 DIAGNOSIS — M542 Cervicalgia: Secondary | ICD-10-CM | POA: Diagnosis not present

## 2018-08-10 DIAGNOSIS — R51 Headache: Secondary | ICD-10-CM | POA: Diagnosis not present

## 2018-08-11 DIAGNOSIS — E1169 Type 2 diabetes mellitus with other specified complication: Secondary | ICD-10-CM | POA: Diagnosis not present

## 2018-08-11 DIAGNOSIS — Z713 Dietary counseling and surveillance: Secondary | ICD-10-CM | POA: Diagnosis not present

## 2018-08-16 DIAGNOSIS — E785 Hyperlipidemia, unspecified: Secondary | ICD-10-CM | POA: Diagnosis not present

## 2018-08-16 DIAGNOSIS — I10 Essential (primary) hypertension: Secondary | ICD-10-CM | POA: Diagnosis not present

## 2018-08-16 DIAGNOSIS — G43109 Migraine with aura, not intractable, without status migrainosus: Secondary | ICD-10-CM | POA: Diagnosis not present

## 2018-08-16 DIAGNOSIS — R7301 Impaired fasting glucose: Secondary | ICD-10-CM | POA: Diagnosis not present

## 2018-08-17 DIAGNOSIS — M546 Pain in thoracic spine: Secondary | ICD-10-CM | POA: Diagnosis not present

## 2018-08-17 DIAGNOSIS — M545 Low back pain: Secondary | ICD-10-CM | POA: Diagnosis not present

## 2018-08-17 DIAGNOSIS — M62838 Other muscle spasm: Secondary | ICD-10-CM | POA: Diagnosis not present

## 2018-08-17 DIAGNOSIS — M5412 Radiculopathy, cervical region: Secondary | ICD-10-CM | POA: Diagnosis not present

## 2018-08-18 ENCOUNTER — Ambulatory Visit: Payer: BLUE CROSS/BLUE SHIELD | Admitting: Neurology

## 2018-08-18 DIAGNOSIS — G629 Polyneuropathy, unspecified: Secondary | ICD-10-CM

## 2018-08-18 DIAGNOSIS — G500 Trigeminal neuralgia: Secondary | ICD-10-CM

## 2018-08-18 DIAGNOSIS — G43709 Chronic migraine without aura, not intractable, without status migrainosus: Secondary | ICD-10-CM

## 2018-08-18 NOTE — Progress Notes (Signed)
Nerve block w/o steroid: Pt signed consent  0.5% Bupivocaine 4.5 mL LOT: [id number] EXP: 01/2020 NDC: 38887-579-72  2% Lidocaine 4.5 mL LOT: [id number] EXP: 01/14/2019 NDC: 8206-0156-15

## 2018-08-22 LAB — MULTIPLE MYELOMA PANEL, SERUM
Albumin SerPl Elph-Mcnc: 4.5 g/dL — ABNORMAL HIGH (ref 2.9–4.4)
Albumin/Glob SerPl: 1.6 (ref 0.7–1.7)
Alpha 1: 0.2 g/dL (ref 0.0–0.4)
Alpha2 Glob SerPl Elph-Mcnc: 0.7 g/dL (ref 0.4–1.0)
B-GLOBULIN SERPL ELPH-MCNC: 1.1 g/dL (ref 0.7–1.3)
GAMMA GLOB SERPL ELPH-MCNC: 0.9 g/dL (ref 0.4–1.8)
Globulin, Total: 3 g/dL (ref 2.2–3.9)
IgA/Immunoglobulin A, Serum: 152 mg/dL (ref 87–352)
IgG (Immunoglobin G), Serum: 1008 mg/dL (ref 700–1600)
IgM (Immunoglobulin M), Srm: 23 mg/dL — ABNORMAL LOW (ref 26–217)
TOTAL PROTEIN: 7.5 g/dL (ref 6.0–8.5)

## 2018-08-22 LAB — B. BURGDORFI ANTIBODIES: Lyme IgG/IgM Ab: <0.91 {ISR} (ref 0.00–0.90)

## 2018-08-22 NOTE — Progress Notes (Signed)
Patient with supraorbital neuralgia. Does extremely well with supraorbital nerve blocks.   Performed by Dr. Anouk Critzer M.D. All procedures a documented blood were medically necessary, reasonable and appropriate based on the patient's history, medical diagnosis and physician opinion. Verbal informed consent was obtained from the patient, patient was informed of potential risk of procedure, including bruising, bleeding, hematoma formation, infection, muscle weakness, muscle pain, numbness, transient hypertension, transient hyperglycemia and transient insomnia among others. All areas injected were topically clean with isopropyl rubbing alcohol. Nonsterile nonlatex gloves were worn during the procedure.  4. Supraorbital nerve block (64400): Supraorbital nerve site was identified along the incision of the frontal bone on the orbital/supraorbital ridge. Medication was injected into the left and right supraorbital nerve areas. Patient's condition is associated with inflammation of the supraorbital and associated muscle groups. Injection was deemed medically necessary, reasonable and appropriate. Injection represents a separate and unique surgical service.  

## 2018-08-29 DIAGNOSIS — M5124 Other intervertebral disc displacement, thoracic region: Secondary | ICD-10-CM | POA: Diagnosis not present

## 2018-08-29 DIAGNOSIS — M542 Cervicalgia: Secondary | ICD-10-CM | POA: Diagnosis not present

## 2018-08-29 DIAGNOSIS — M545 Low back pain: Secondary | ICD-10-CM | POA: Diagnosis not present

## 2018-08-29 DIAGNOSIS — M461 Sacroiliitis, not elsewhere classified: Secondary | ICD-10-CM | POA: Diagnosis not present

## 2018-09-13 DIAGNOSIS — M546 Pain in thoracic spine: Secondary | ICD-10-CM | POA: Diagnosis not present

## 2018-09-13 DIAGNOSIS — R51 Headache: Secondary | ICD-10-CM | POA: Diagnosis not present

## 2018-09-13 DIAGNOSIS — M545 Low back pain: Secondary | ICD-10-CM | POA: Diagnosis not present

## 2018-09-13 DIAGNOSIS — M542 Cervicalgia: Secondary | ICD-10-CM | POA: Diagnosis not present

## 2018-09-28 ENCOUNTER — Other Ambulatory Visit: Payer: Self-pay | Admitting: Neurology

## 2018-09-29 DIAGNOSIS — E785 Hyperlipidemia, unspecified: Secondary | ICD-10-CM | POA: Diagnosis not present

## 2018-09-29 DIAGNOSIS — I10 Essential (primary) hypertension: Secondary | ICD-10-CM | POA: Diagnosis not present

## 2018-09-29 DIAGNOSIS — E790 Hyperuricemia without signs of inflammatory arthritis and tophaceous disease: Secondary | ICD-10-CM | POA: Diagnosis not present

## 2018-09-29 DIAGNOSIS — E559 Vitamin D deficiency, unspecified: Secondary | ICD-10-CM | POA: Diagnosis not present

## 2018-09-29 DIAGNOSIS — E039 Hypothyroidism, unspecified: Secondary | ICD-10-CM | POA: Diagnosis not present

## 2018-09-29 DIAGNOSIS — R7301 Impaired fasting glucose: Secondary | ICD-10-CM | POA: Diagnosis not present

## 2018-09-29 DIAGNOSIS — Z79899 Other long term (current) drug therapy: Secondary | ICD-10-CM | POA: Diagnosis not present

## 2018-09-29 DIAGNOSIS — E161 Other hypoglycemia: Secondary | ICD-10-CM | POA: Diagnosis not present

## 2018-10-18 DIAGNOSIS — R51 Headache: Secondary | ICD-10-CM | POA: Diagnosis not present

## 2018-10-18 DIAGNOSIS — M542 Cervicalgia: Secondary | ICD-10-CM | POA: Diagnosis not present

## 2018-10-18 DIAGNOSIS — M545 Low back pain: Secondary | ICD-10-CM | POA: Diagnosis not present

## 2018-10-18 DIAGNOSIS — M546 Pain in thoracic spine: Secondary | ICD-10-CM | POA: Diagnosis not present

## 2018-10-25 DIAGNOSIS — Z03818 Encounter for observation for suspected exposure to other biological agents ruled out: Secondary | ICD-10-CM | POA: Diagnosis not present

## 2018-10-31 DIAGNOSIS — L300 Nummular dermatitis: Secondary | ICD-10-CM | POA: Diagnosis not present

## 2018-11-01 DIAGNOSIS — M546 Pain in thoracic spine: Secondary | ICD-10-CM | POA: Diagnosis not present

## 2018-11-01 DIAGNOSIS — G43109 Migraine with aura, not intractable, without status migrainosus: Secondary | ICD-10-CM | POA: Diagnosis not present

## 2018-11-01 DIAGNOSIS — M5441 Lumbago with sciatica, right side: Secondary | ICD-10-CM | POA: Diagnosis not present

## 2018-11-01 DIAGNOSIS — M461 Sacroiliitis, not elsewhere classified: Secondary | ICD-10-CM | POA: Diagnosis not present

## 2018-11-02 ENCOUNTER — Ambulatory Visit (INDEPENDENT_AMBULATORY_CARE_PROVIDER_SITE_OTHER): Payer: BLUE CROSS/BLUE SHIELD | Admitting: Neurology

## 2018-11-02 ENCOUNTER — Other Ambulatory Visit: Payer: Self-pay

## 2018-11-02 DIAGNOSIS — G500 Trigeminal neuralgia: Secondary | ICD-10-CM

## 2018-11-02 DIAGNOSIS — G43709 Chronic migraine without aura, not intractable, without status migrainosus: Secondary | ICD-10-CM

## 2018-11-02 MED ORDER — GABAPENTIN 300 MG PO CAPS: 300.0000 mg | ORAL_CAPSULE | Freq: Three times a day (TID) | ORAL | 4 refills | Status: DC

## 2018-11-02 NOTE — Patient Instructions (Signed)
Gabapentin capsules or tablets What is this medicine? GABAPENTIN (GA ba pen tin) is used to control seizures in certain types of epilepsy. It is also used to treat certain types of nerve pain. This medicine may be used for other purposes; ask your health care provider or pharmacist if you have questions. COMMON BRAND NAME(S): Active-PAC with Gabapentin, Gabarone, Neurontin What should I tell my health care provider before I take this medicine? They need to know if you have any of these conditions: -kidney disease -suicidal thoughts, plans, or attempt; a previous suicide attempt by you or a family member -an unusual or allergic reaction to gabapentin, other medicines, foods, dyes, or preservatives -pregnant or trying to get pregnant -breast-feeding How should I use this medicine? Take this medicine by mouth with a glass of water. Follow the directions on the prescription label. You can take it with or without food. If it upsets your stomach, take it with food. Take your medicine at regular intervals. Do not take it more often than directed. Do not stop taking except on your doctor's advice. If you are directed to break the 600 or 800 mg tablets in half as part of your dose, the extra half tablet should be used for the next dose. If you have not used the extra half tablet within 28 days, it should be thrown away. A special MedGuide will be given to you by the pharmacist with each prescription and refill. Be sure to read this information carefully each time. Talk to your pediatrician regarding the use of this medicine in children. While this drug may be prescribed for children as young as 3 years for selected conditions, precautions do apply. Overdosage: If you think you have taken too much of this medicine contact a poison control center or emergency room at once. NOTE: This medicine is only for you. Do not share this medicine with others. What if I miss a dose? If you miss a dose, take it as soon  as you can. If it is almost time for your next dose, take only that dose. Do not take double or extra doses. What may interact with this medicine? Do not take this medicine with any of the following medications: -other gabapentin products This medicine may also interact with the following medications: -alcohol -antacids -antihistamines for allergy, cough and cold -certain medicines for anxiety or sleep -certain medicines for depression or psychotic disturbances -homatropine; hydrocodone -naproxen -narcotic medicines (opiates) for pain -phenothiazines like chlorpromazine, mesoridazine, prochlorperazine, thioridazine This list may not describe all possible interactions. Give your health care provider a list of all the medicines, herbs, non-prescription drugs, or dietary supplements you use. Also tell them if you smoke, drink alcohol, or use illegal drugs. Some items may interact with your medicine. What should I watch for while using this medicine? Visit your doctor or health care professional for regular checks on your progress. You may want to keep a record at home of how you feel your condition is responding to treatment. You may want to share this information with your doctor or health care professional at each visit. You should contact your doctor or health care professional if your seizures get worse or if you have any new types of seizures. Do not stop taking this medicine or any of your seizure medicines unless instructed by your doctor or health care professional. Stopping your medicine suddenly can increase your seizures or their severity. Wear a medical identification bracelet or chain if you are taking this medicine for   seizures, and carry a card that lists all your medications. You may get drowsy, dizzy, or have blurred vision. Do not drive, use machinery, or do anything that needs mental alertness until you know how this medicine affects you. To reduce dizzy or fainting spells, do not  sit or stand up quickly, especially if you are an older patient. Alcohol can increase drowsiness and dizziness. Avoid alcoholic drinks. Your mouth may get dry. Chewing sugarless gum or sucking hard candy, and drinking plenty of water will help. The use of this medicine may increase the chance of suicidal thoughts or actions. Pay special attention to how you are responding while on this medicine. Any worsening of mood, or thoughts of suicide or dying should be reported to your health care professional right away. Women who become pregnant while using this medicine may enroll in the [place] American Antiepileptic Drug Pregnancy Registry by calling 1-888-233-2334. This registry collects information about the safety of antiepileptic drug use during pregnancy. What side effects may I notice from receiving this medicine? Side effects that you should report to your doctor or health care professional as soon as possible: -allergic reactions like skin rash, itching or hives, swelling of the face, lips, or tongue -worsening of mood, thoughts or actions of suicide or dying Side effects that usually do not require medical attention (report to your doctor or health care professional if they continue or are bothersome): -constipation -difficulty walking or controlling muscle movements -dizziness -nausea -slurred speech -tiredness -tremors -weight gain This list may not describe all possible side effects. Call your doctor for medical advice about side effects. You may report side effects to FDA at 1-800-FDA-1088. Where should I keep my medicine? Keep out of reach of children. This medicine may cause accidental overdose and death if it taken by other adults, children, or pets. Mix any unused medicine with a substance like cat litter or coffee grounds. Then throw the medicine away in a sealed container like a sealed bag or a coffee can with a lid. Do not use the medicine after the expiration date. Store at room  temperature between 15 and 30 degrees C (59 and 86 degrees F). NOTE: This sheet is a summary. It may not cover all possible information. If you have questions about this medicine, talk to your doctor, pharmacist, or health care provider.  2019 Elsevier/Gold Standard (2017-11-04 13:21:44)  

## 2018-11-02 NOTE — Progress Notes (Signed)
Nerve block w/o steroid: Pt signed consent  0.5% Bupivocaine 4.5 mL LOT: [id number] EXP: 01/2020 NDC: 89791-504-13  2% Lidocaine 4.5 mL LOT: [id number] EXP: 12/14/2019 NDC: 9396-8864-84

## 2018-11-02 NOTE — Progress Notes (Signed)
Patient with supraorbital neuralgia. Does extremely well with supraorbital nerve blocks. Prescribed Gabapentin as well.  Meds ordered this encounter  Medications  . gabapentin (NEURONTIN) 300 MG capsule    Sig: Take 1 capsule (300 mg total) by mouth 3 (three) times daily.    Dispense:  270 capsule    Refill:  4    Performed by Dr. Jaynee Eagles M.D. All procedures a documented blood were medically necessary, reasonable and appropriate based on the patient's history, medical diagnosis and physician opinion. Verbal informed consent was obtained from the patient, patient was informed of potential risk of procedure, including bruising, bleeding, hematoma formation, infection, muscle weakness, muscle pain, numbness, transient hypertension, transient hyperglycemia and transient insomnia among others. All areas injected were topically clean with isopropyl rubbing alcohol. Nonsterile nonlatex gloves were worn during the procedure.  4. Supraorbital nerve block (64400): Supraorbital nerve site was identified along the incision of the frontal bone on the orbital/supraorbital ridge. Medication was injected into the left and right supraorbital nerve areas. Patient's condition is associated with inflammation of the supraorbital and associated muscle groups. Injection was deemed medically necessary, reasonable and appropriate. Injection represents a separate and unique surgical service.

## 2018-11-10 DIAGNOSIS — R21 Rash and other nonspecific skin eruption: Secondary | ICD-10-CM | POA: Diagnosis not present

## 2018-11-10 DIAGNOSIS — Z6828 Body mass index (BMI) 28.0-28.9, adult: Secondary | ICD-10-CM | POA: Diagnosis not present

## 2018-11-10 DIAGNOSIS — M2550 Pain in unspecified joint: Secondary | ICD-10-CM | POA: Diagnosis not present

## 2018-11-10 DIAGNOSIS — I10 Essential (primary) hypertension: Secondary | ICD-10-CM | POA: Diagnosis not present

## 2018-11-17 DIAGNOSIS — M545 Low back pain: Secondary | ICD-10-CM | POA: Diagnosis not present

## 2018-11-17 DIAGNOSIS — M546 Pain in thoracic spine: Secondary | ICD-10-CM | POA: Diagnosis not present

## 2018-11-17 DIAGNOSIS — M542 Cervicalgia: Secondary | ICD-10-CM | POA: Diagnosis not present

## 2018-11-17 DIAGNOSIS — R51 Headache: Secondary | ICD-10-CM | POA: Diagnosis not present

## 2018-12-20 ENCOUNTER — Other Ambulatory Visit: Payer: Self-pay | Admitting: Neurology

## 2018-12-22 DIAGNOSIS — M461 Sacroiliitis, not elsewhere classified: Secondary | ICD-10-CM | POA: Diagnosis not present

## 2018-12-22 DIAGNOSIS — G43109 Migraine with aura, not intractable, without status migrainosus: Secondary | ICD-10-CM | POA: Diagnosis not present

## 2018-12-22 DIAGNOSIS — M546 Pain in thoracic spine: Secondary | ICD-10-CM | POA: Diagnosis not present

## 2018-12-22 DIAGNOSIS — M542 Cervicalgia: Secondary | ICD-10-CM | POA: Diagnosis not present

## 2018-12-27 DIAGNOSIS — M2550 Pain in unspecified joint: Secondary | ICD-10-CM | POA: Diagnosis not present

## 2018-12-27 DIAGNOSIS — E1169 Type 2 diabetes mellitus with other specified complication: Secondary | ICD-10-CM | POA: Diagnosis not present

## 2018-12-27 DIAGNOSIS — E039 Hypothyroidism, unspecified: Secondary | ICD-10-CM | POA: Diagnosis not present

## 2018-12-27 DIAGNOSIS — R21 Rash and other nonspecific skin eruption: Secondary | ICD-10-CM | POA: Diagnosis not present

## 2018-12-27 DIAGNOSIS — Z79899 Other long term (current) drug therapy: Secondary | ICD-10-CM | POA: Diagnosis not present

## 2018-12-28 ENCOUNTER — Ambulatory Visit (INDEPENDENT_AMBULATORY_CARE_PROVIDER_SITE_OTHER): Payer: BC Managed Care – PPO | Admitting: Neurology

## 2018-12-28 VITALS — Temp 97.8°F

## 2018-12-28 DIAGNOSIS — G500 Trigeminal neuralgia: Secondary | ICD-10-CM

## 2018-12-28 NOTE — Progress Notes (Signed)
Patient with supraorbital neuralgia. Does extremely well with supraorbital nerve blocks.   Performed by Dr. Jaynee Eagles M.D. All procedures a documented blood were medically necessary, reasonable and appropriate based on the patient's history, medical diagnosis and physician opinion. Verbal informed consent was obtained from the patient, patient was informed of potential risk of procedure, including bruising, bleeding, hematoma formation, infection, muscle weakness, muscle pain, numbness, transient hypertension, transient hyperglycemia and transient insomnia among others. All areas injected were topically clean with isopropyl rubbing alcohol. Nonsterile nonlatex gloves were worn during the procedure.  4. Supraorbital nerve block (64400): Supraorbital nerve site was identified along the incision of the frontal bone on the orbital/supraorbital ridge. Medication was injected into the left and right supraorbital nerve areas. Patient's condition is associated with inflammation of the supraorbital and associated muscle groups. Injection was deemed medically necessary, reasonable and appropriate. Injection represents a separate and unique surgical service.

## 2018-12-28 NOTE — Progress Notes (Signed)
Nerve block w/o steroid: Pt signed consent  0.5% Bupivocaine 4.5 mL LOT: [id number] EXP: [id number] NDC: 37048-889-16  2% Lidocaine 4.5 mL LOT: [id number] EXP: 01/14/2019 NDC: 9450-3888-28

## 2018-12-30 DIAGNOSIS — Z23 Encounter for immunization: Secondary | ICD-10-CM | POA: Diagnosis not present

## 2019-01-12 DIAGNOSIS — M542 Cervicalgia: Secondary | ICD-10-CM | POA: Diagnosis not present

## 2019-01-12 DIAGNOSIS — R51 Headache: Secondary | ICD-10-CM | POA: Diagnosis not present

## 2019-01-12 DIAGNOSIS — M546 Pain in thoracic spine: Secondary | ICD-10-CM | POA: Diagnosis not present

## 2019-01-12 DIAGNOSIS — M545 Low back pain: Secondary | ICD-10-CM | POA: Diagnosis not present

## 2019-01-23 DIAGNOSIS — Z6828 Body mass index (BMI) 28.0-28.9, adult: Secondary | ICD-10-CM | POA: Diagnosis not present

## 2019-01-23 DIAGNOSIS — R0989 Other specified symptoms and signs involving the circulatory and respiratory systems: Secondary | ICD-10-CM | POA: Diagnosis not present

## 2019-01-24 DIAGNOSIS — M25531 Pain in right wrist: Secondary | ICD-10-CM | POA: Diagnosis not present

## 2019-01-24 DIAGNOSIS — M25532 Pain in left wrist: Secondary | ICD-10-CM | POA: Diagnosis not present

## 2019-01-24 DIAGNOSIS — M5412 Radiculopathy, cervical region: Secondary | ICD-10-CM | POA: Diagnosis not present

## 2019-01-24 DIAGNOSIS — M546 Pain in thoracic spine: Secondary | ICD-10-CM | POA: Diagnosis not present

## 2019-02-07 ENCOUNTER — Encounter: Payer: Self-pay | Admitting: *Deleted

## 2019-02-13 ENCOUNTER — Ambulatory Visit (INDEPENDENT_AMBULATORY_CARE_PROVIDER_SITE_OTHER): Payer: BC Managed Care – PPO | Admitting: Neurology

## 2019-02-13 ENCOUNTER — Other Ambulatory Visit: Payer: Self-pay

## 2019-02-13 ENCOUNTER — Other Ambulatory Visit: Payer: Self-pay | Admitting: Neurology

## 2019-02-13 DIAGNOSIS — G500 Trigeminal neuralgia: Secondary | ICD-10-CM | POA: Diagnosis not present

## 2019-02-13 DIAGNOSIS — G5603 Carpal tunnel syndrome, bilateral upper limbs: Secondary | ICD-10-CM

## 2019-02-13 NOTE — Progress Notes (Signed)
Patient with supraorbital neuralgia. Does extremely well with supraorbital nerve blocks.   Performed by Dr. Glennys Schorsch M.D. All procedures a documented blood were medically necessary, reasonable and appropriate based on the patient's history, medical diagnosis and physician opinion. Verbal informed consent was obtained from the patient, patient was informed of potential risk of procedure, including bruising, bleeding, hematoma formation, infection, muscle weakness, muscle pain, numbness, transient hypertension, transient hyperglycemia and transient insomnia among others. All areas injected were topically clean with isopropyl rubbing alcohol. Nonsterile nonlatex gloves were worn during the procedure.  4. Supraorbital nerve block (64400): Supraorbital nerve site was identified along the incision of the frontal bone on the orbital/supraorbital ridge. Medication was injected into the left and right supraorbital nerve areas. Patient's condition is associated with inflammation of the supraorbital and associated muscle groups. Injection was deemed medically necessary, reasonable and appropriate. Injection represents a separate and unique surgical service.  

## 2019-02-13 NOTE — Progress Notes (Signed)
Nerve block w/o steroid: Pt signed consent  0.5% Bupivocaine 6 mL LOT: [id number] EXP:01/2020 Leslie: [id number]  2% Lidocaine 6 mL LOT: [id number] EXP: 12/14/2019 NDC: [id number]

## 2019-02-17 DIAGNOSIS — R0989 Other specified symptoms and signs involving the circulatory and respiratory systems: Secondary | ICD-10-CM | POA: Diagnosis not present

## 2019-02-23 DIAGNOSIS — M546 Pain in thoracic spine: Secondary | ICD-10-CM | POA: Diagnosis not present

## 2019-02-23 DIAGNOSIS — M542 Cervicalgia: Secondary | ICD-10-CM | POA: Diagnosis not present

## 2019-02-23 DIAGNOSIS — M545 Low back pain: Secondary | ICD-10-CM | POA: Diagnosis not present

## 2019-02-23 DIAGNOSIS — R51 Headache: Secondary | ICD-10-CM | POA: Diagnosis not present

## 2019-03-07 DIAGNOSIS — M546 Pain in thoracic spine: Secondary | ICD-10-CM | POA: Diagnosis not present

## 2019-03-07 DIAGNOSIS — M545 Low back pain: Secondary | ICD-10-CM | POA: Diagnosis not present

## 2019-03-07 DIAGNOSIS — M25541 Pain in joints of right hand: Secondary | ICD-10-CM | POA: Diagnosis not present

## 2019-03-07 DIAGNOSIS — R51 Headache: Secondary | ICD-10-CM | POA: Diagnosis not present

## 2019-03-09 DIAGNOSIS — Z23 Encounter for immunization: Secondary | ICD-10-CM | POA: Diagnosis not present

## 2019-03-15 ENCOUNTER — Other Ambulatory Visit: Payer: Self-pay | Admitting: Neurology

## 2019-03-20 ENCOUNTER — Other Ambulatory Visit: Payer: Self-pay | Admitting: Neurology

## 2019-03-22 DIAGNOSIS — M79641 Pain in right hand: Secondary | ICD-10-CM | POA: Diagnosis not present

## 2019-03-22 DIAGNOSIS — G5603 Carpal tunnel syndrome, bilateral upper limbs: Secondary | ICD-10-CM | POA: Diagnosis not present

## 2019-03-22 DIAGNOSIS — M79642 Pain in left hand: Secondary | ICD-10-CM | POA: Diagnosis not present

## 2019-03-23 DIAGNOSIS — G5601 Carpal tunnel syndrome, right upper limb: Secondary | ICD-10-CM | POA: Diagnosis not present

## 2019-03-23 DIAGNOSIS — G43109 Migraine with aura, not intractable, without status migrainosus: Secondary | ICD-10-CM | POA: Diagnosis not present

## 2019-03-23 DIAGNOSIS — M546 Pain in thoracic spine: Secondary | ICD-10-CM | POA: Diagnosis not present

## 2019-03-23 DIAGNOSIS — M542 Cervicalgia: Secondary | ICD-10-CM | POA: Diagnosis not present

## 2019-03-24 DIAGNOSIS — Z23 Encounter for immunization: Secondary | ICD-10-CM | POA: Diagnosis not present

## 2019-03-29 ENCOUNTER — Ambulatory Visit (INDEPENDENT_AMBULATORY_CARE_PROVIDER_SITE_OTHER): Payer: BC Managed Care – PPO | Admitting: Neurology

## 2019-03-29 ENCOUNTER — Other Ambulatory Visit: Payer: Self-pay

## 2019-03-29 VITALS — Temp 97.1°F

## 2019-03-29 DIAGNOSIS — G500 Trigeminal neuralgia: Secondary | ICD-10-CM | POA: Diagnosis not present

## 2019-03-29 NOTE — Progress Notes (Signed)
Nerve block w/o steroid: Pt signed consent  0.5% Bupivocaine 4.5 mL LOT: [id number] EXP: 01/2020 NDC: [id number]  2% Lidocaine 4.5 mL LOT: [id number] EXP: 12/14/2019 NDC: [id number]

## 2019-03-29 NOTE — Progress Notes (Signed)
Patient with supraorbital neuralgia. Does extremely well with supraorbital nerve blocks.   Performed by Dr. Jaynee Eagles M.D. All procedures a documented blood were medically necessary, reasonable and appropriate based on the patient's history, medical diagnosis and physician opinion. Verbal informed consent was obtained from the patient, patient was informed of potential risk of procedure, including bruising, bleeding, hematoma formation, infection, muscle weakness, muscle pain, numbness, transient hypertension, transient hyperglycemia and transient insomnia among others. All areas injected were topically clean with isopropyl rubbing alcohol. Nonsterile nonlatex gloves were worn during the procedure.   Supraorbital nerve block (64400): Supraorbital nerve site was identified along the incision of the frontal bone on the orbital/supraorbital ridge. Medication was injected into the left and right supraorbital nerve areas. Patient's condition is associated with inflammation of the supraorbital and associated muscle groups. Injection was deemed medically necessary, reasonable and appropriate. Injection represents a separate and unique surgical service.

## 2019-04-06 DIAGNOSIS — M542 Cervicalgia: Secondary | ICD-10-CM | POA: Diagnosis not present

## 2019-04-06 DIAGNOSIS — G5601 Carpal tunnel syndrome, right upper limb: Secondary | ICD-10-CM | POA: Diagnosis not present

## 2019-04-06 DIAGNOSIS — G43109 Migraine with aura, not intractable, without status migrainosus: Secondary | ICD-10-CM | POA: Diagnosis not present

## 2019-04-06 DIAGNOSIS — M546 Pain in thoracic spine: Secondary | ICD-10-CM | POA: Diagnosis not present

## 2019-04-16 HISTORY — PX: OTHER SURGICAL HISTORY: SHX169

## 2019-04-25 DIAGNOSIS — G44209 Tension-type headache, unspecified, not intractable: Secondary | ICD-10-CM | POA: Diagnosis not present

## 2019-04-25 DIAGNOSIS — M545 Low back pain: Secondary | ICD-10-CM | POA: Diagnosis not present

## 2019-04-25 DIAGNOSIS — M542 Cervicalgia: Secondary | ICD-10-CM | POA: Diagnosis not present

## 2019-04-25 DIAGNOSIS — M546 Pain in thoracic spine: Secondary | ICD-10-CM | POA: Diagnosis not present

## 2019-04-26 ENCOUNTER — Ambulatory Visit (INDEPENDENT_AMBULATORY_CARE_PROVIDER_SITE_OTHER): Payer: BC Managed Care – PPO | Admitting: Neurology

## 2019-04-26 ENCOUNTER — Encounter: Payer: Self-pay | Admitting: Neurology

## 2019-04-26 ENCOUNTER — Other Ambulatory Visit: Payer: Self-pay

## 2019-04-26 DIAGNOSIS — G500 Trigeminal neuralgia: Secondary | ICD-10-CM

## 2019-04-26 DIAGNOSIS — G5603 Carpal tunnel syndrome, bilateral upper limbs: Secondary | ICD-10-CM | POA: Diagnosis not present

## 2019-04-26 NOTE — Progress Notes (Signed)
Nerve block w/o steroid: Pt signed consent  0.5% Bupivocaine 6 mL LOT: [id number] EXP: 01/2020 NDC: [id number]  2% Lidocaine 6 mL LOT: [id number] EXP: 12/14/2019 NDC: [id number]

## 2019-04-27 NOTE — Progress Notes (Signed)
Patient with supraorbital neuralgia. Does extremely well with supraorbital nerve blocks.   Performed by Dr. Guila Owensby M.D. All procedures a documented blood were medically necessary, reasonable and appropriate based on the patient's history, medical diagnosis and physician opinion. Verbal informed consent was obtained from the patient, patient was informed of potential risk of procedure, including bruising, bleeding, hematoma formation, infection, muscle weakness, muscle pain, numbness, transient hypertension, transient hyperglycemia and transient insomnia among others. All areas injected were topically clean with isopropyl rubbing alcohol. Nonsterile nonlatex gloves were worn during the procedure.  4. Supraorbital nerve block (64400): Supraorbital nerve site was identified along the incision of the frontal bone on the orbital/supraorbital ridge. Medication was injected into the left and right supraorbital nerve areas. Patient's condition is associated with inflammation of the supraorbital and associated muscle groups. Injection was deemed medically necessary, reasonable and appropriate. Injection represents a separate and unique surgical service.  

## 2019-05-04 DIAGNOSIS — E039 Hypothyroidism, unspecified: Secondary | ICD-10-CM | POA: Diagnosis not present

## 2019-05-04 DIAGNOSIS — I10 Essential (primary) hypertension: Secondary | ICD-10-CM | POA: Diagnosis not present

## 2019-05-04 DIAGNOSIS — E1169 Type 2 diabetes mellitus with other specified complication: Secondary | ICD-10-CM | POA: Diagnosis not present

## 2019-05-04 DIAGNOSIS — E790 Hyperuricemia without signs of inflammatory arthritis and tophaceous disease: Secondary | ICD-10-CM | POA: Diagnosis not present

## 2019-05-04 DIAGNOSIS — E161 Other hypoglycemia: Secondary | ICD-10-CM | POA: Diagnosis not present

## 2019-05-04 DIAGNOSIS — Z79899 Other long term (current) drug therapy: Secondary | ICD-10-CM | POA: Diagnosis not present

## 2019-05-04 DIAGNOSIS — E785 Hyperlipidemia, unspecified: Secondary | ICD-10-CM | POA: Diagnosis not present

## 2019-05-04 DIAGNOSIS — E559 Vitamin D deficiency, unspecified: Secondary | ICD-10-CM | POA: Diagnosis not present

## 2019-05-18 DIAGNOSIS — M545 Low back pain: Secondary | ICD-10-CM | POA: Diagnosis not present

## 2019-05-18 DIAGNOSIS — G43109 Migraine with aura, not intractable, without status migrainosus: Secondary | ICD-10-CM | POA: Diagnosis not present

## 2019-05-18 DIAGNOSIS — M542 Cervicalgia: Secondary | ICD-10-CM | POA: Diagnosis not present

## 2019-05-18 DIAGNOSIS — M546 Pain in thoracic spine: Secondary | ICD-10-CM | POA: Diagnosis not present

## 2019-05-21 DIAGNOSIS — M79672 Pain in left foot: Secondary | ICD-10-CM | POA: Diagnosis not present

## 2019-05-30 DIAGNOSIS — M62838 Other muscle spasm: Secondary | ICD-10-CM | POA: Diagnosis not present

## 2019-05-30 DIAGNOSIS — M545 Low back pain: Secondary | ICD-10-CM | POA: Diagnosis not present

## 2019-05-30 DIAGNOSIS — M542 Cervicalgia: Secondary | ICD-10-CM | POA: Diagnosis not present

## 2019-05-30 DIAGNOSIS — M546 Pain in thoracic spine: Secondary | ICD-10-CM | POA: Diagnosis not present

## 2019-06-13 DIAGNOSIS — M546 Pain in thoracic spine: Secondary | ICD-10-CM | POA: Diagnosis not present

## 2019-06-13 DIAGNOSIS — M62838 Other muscle spasm: Secondary | ICD-10-CM | POA: Diagnosis not present

## 2019-06-13 DIAGNOSIS — M461 Sacroiliitis, not elsewhere classified: Secondary | ICD-10-CM | POA: Diagnosis not present

## 2019-06-13 DIAGNOSIS — M545 Low back pain: Secondary | ICD-10-CM | POA: Diagnosis not present

## 2019-07-03 ENCOUNTER — Other Ambulatory Visit: Payer: Self-pay

## 2019-07-03 ENCOUNTER — Ambulatory Visit: Payer: BC Managed Care – PPO | Admitting: Neurology

## 2019-07-03 VITALS — Temp 97.7°F

## 2019-07-03 DIAGNOSIS — I87393 Chronic venous hypertension (idiopathic) with other complications of bilateral lower extremity: Secondary | ICD-10-CM | POA: Diagnosis not present

## 2019-07-03 DIAGNOSIS — G500 Trigeminal neuralgia: Secondary | ICD-10-CM | POA: Diagnosis not present

## 2019-07-03 NOTE — Progress Notes (Signed)
Nerve block w/o steroid: Pt signed consent  0.5% Bupivocaine 6 mL LOT: [id number] EXP: 11/21 NDC: [id number]  2% Lidocaine 6 mL LOT: [id number] EXP: 12/14/2019 NDC: [id number]

## 2019-07-04 DIAGNOSIS — M542 Cervicalgia: Secondary | ICD-10-CM | POA: Diagnosis not present

## 2019-07-04 DIAGNOSIS — M546 Pain in thoracic spine: Secondary | ICD-10-CM | POA: Diagnosis not present

## 2019-07-04 DIAGNOSIS — M545 Low back pain: Secondary | ICD-10-CM | POA: Diagnosis not present

## 2019-07-04 DIAGNOSIS — M62838 Other muscle spasm: Secondary | ICD-10-CM | POA: Diagnosis not present

## 2019-07-04 NOTE — Progress Notes (Signed)
Patient with supraorbital neuralgia. Does extremely well with supraorbital nerve blocks.   Performed by Dr. Jaynee Eagles M.D. All procedures a documented blood were medically necessary, reasonable and appropriate based on the patient's history, medical diagnosis and physician opinion. Verbal informed consent was obtained from the patient, patient was informed of potential risk of procedure, including bruising, bleeding, hematoma formation, infection, muscle weakness, muscle pain, numbness, transient hypertension, transient hyperglycemia and transient insomnia among others. All areas injected were topically clean with isopropyl rubbing alcohol. Nonsterile nonlatex gloves were worn during the procedure.  4. Supraorbital nerve block (64400): Supraorbital nerve site was identified along the incision of the frontal bone on the orbital/supraorbital ridge. Medication was injected into the left and right supraorbital nerve areas. Patient's condition is associated with inflammation of the supraorbital and associated muscle groups. Injection was deemed medically necessary, reasonable and appropriate. Injection represents a separate and unique surgical service.

## 2019-07-18 DIAGNOSIS — M546 Pain in thoracic spine: Secondary | ICD-10-CM | POA: Diagnosis not present

## 2019-07-18 DIAGNOSIS — M25541 Pain in joints of right hand: Secondary | ICD-10-CM | POA: Diagnosis not present

## 2019-07-18 DIAGNOSIS — M545 Low back pain: Secondary | ICD-10-CM | POA: Diagnosis not present

## 2019-07-18 DIAGNOSIS — M542 Cervicalgia: Secondary | ICD-10-CM | POA: Diagnosis not present

## 2019-07-19 DIAGNOSIS — Z1231 Encounter for screening mammogram for malignant neoplasm of breast: Secondary | ICD-10-CM | POA: Diagnosis not present

## 2019-07-25 DIAGNOSIS — I87393 Chronic venous hypertension (idiopathic) with other complications of bilateral lower extremity: Secondary | ICD-10-CM | POA: Diagnosis not present

## 2019-07-31 DIAGNOSIS — I87393 Chronic venous hypertension (idiopathic) with other complications of bilateral lower extremity: Secondary | ICD-10-CM | POA: Diagnosis not present

## 2019-07-31 DIAGNOSIS — M79642 Pain in left hand: Secondary | ICD-10-CM | POA: Diagnosis not present

## 2019-07-31 DIAGNOSIS — I7300 Raynaud's syndrome without gangrene: Secondary | ICD-10-CM | POA: Diagnosis not present

## 2019-07-31 DIAGNOSIS — M79604 Pain in right leg: Secondary | ICD-10-CM | POA: Diagnosis not present

## 2019-07-31 DIAGNOSIS — G5601 Carpal tunnel syndrome, right upper limb: Secondary | ICD-10-CM | POA: Diagnosis not present

## 2019-07-31 DIAGNOSIS — M79641 Pain in right hand: Secondary | ICD-10-CM | POA: Diagnosis not present

## 2019-07-31 DIAGNOSIS — G5603 Carpal tunnel syndrome, bilateral upper limbs: Secondary | ICD-10-CM | POA: Diagnosis not present

## 2019-07-31 DIAGNOSIS — M79605 Pain in left leg: Secondary | ICD-10-CM | POA: Diagnosis not present

## 2019-08-01 DIAGNOSIS — M546 Pain in thoracic spine: Secondary | ICD-10-CM | POA: Diagnosis not present

## 2019-08-01 DIAGNOSIS — M461 Sacroiliitis, not elsewhere classified: Secondary | ICD-10-CM | POA: Diagnosis not present

## 2019-08-01 DIAGNOSIS — G44209 Tension-type headache, unspecified, not intractable: Secondary | ICD-10-CM | POA: Diagnosis not present

## 2019-08-01 DIAGNOSIS — M542 Cervicalgia: Secondary | ICD-10-CM | POA: Diagnosis not present

## 2019-08-15 DIAGNOSIS — M542 Cervicalgia: Secondary | ICD-10-CM | POA: Diagnosis not present

## 2019-08-15 DIAGNOSIS — M461 Sacroiliitis, not elsewhere classified: Secondary | ICD-10-CM | POA: Diagnosis not present

## 2019-08-15 DIAGNOSIS — M546 Pain in thoracic spine: Secondary | ICD-10-CM | POA: Diagnosis not present

## 2019-08-15 DIAGNOSIS — M545 Low back pain: Secondary | ICD-10-CM | POA: Diagnosis not present

## 2019-08-29 DIAGNOSIS — M546 Pain in thoracic spine: Secondary | ICD-10-CM | POA: Diagnosis not present

## 2019-08-29 DIAGNOSIS — M545 Low back pain: Secondary | ICD-10-CM | POA: Diagnosis not present

## 2019-08-29 DIAGNOSIS — M542 Cervicalgia: Secondary | ICD-10-CM | POA: Diagnosis not present

## 2019-08-29 DIAGNOSIS — M26622 Arthralgia of left temporomandibular joint: Secondary | ICD-10-CM | POA: Diagnosis not present

## 2019-09-04 DIAGNOSIS — E785 Hyperlipidemia, unspecified: Secondary | ICD-10-CM | POA: Diagnosis not present

## 2019-09-04 DIAGNOSIS — E161 Other hypoglycemia: Secondary | ICD-10-CM | POA: Diagnosis not present

## 2019-09-04 DIAGNOSIS — I10 Essential (primary) hypertension: Secondary | ICD-10-CM | POA: Diagnosis not present

## 2019-09-04 DIAGNOSIS — E790 Hyperuricemia without signs of inflammatory arthritis and tophaceous disease: Secondary | ICD-10-CM | POA: Diagnosis not present

## 2019-09-04 DIAGNOSIS — E1169 Type 2 diabetes mellitus with other specified complication: Secondary | ICD-10-CM | POA: Diagnosis not present

## 2019-09-04 DIAGNOSIS — Z79899 Other long term (current) drug therapy: Secondary | ICD-10-CM | POA: Diagnosis not present

## 2019-09-04 DIAGNOSIS — E039 Hypothyroidism, unspecified: Secondary | ICD-10-CM | POA: Diagnosis not present

## 2019-09-04 DIAGNOSIS — E559 Vitamin D deficiency, unspecified: Secondary | ICD-10-CM | POA: Diagnosis not present

## 2019-09-08 ENCOUNTER — Other Ambulatory Visit (HOSPITAL_COMMUNITY): Payer: Self-pay | Admitting: Family Medicine

## 2019-09-08 DIAGNOSIS — I7300 Raynaud's syndrome without gangrene: Secondary | ICD-10-CM

## 2019-09-11 ENCOUNTER — Ambulatory Visit (HOSPITAL_COMMUNITY)
Admission: RE | Admit: 2019-09-11 | Discharge: 2019-09-11 | Disposition: A | Discharge status: Home / Self Care | Payer: BC Managed Care – PPO | Source: Ambulatory Visit | Attending: Surgery | Admitting: Surgery

## 2019-09-11 ENCOUNTER — Other Ambulatory Visit: Payer: Self-pay

## 2019-09-11 DIAGNOSIS — I7300 Raynaud's syndrome without gangrene: Secondary | ICD-10-CM | POA: Insufficient documentation

## 2019-09-21 DIAGNOSIS — M546 Pain in thoracic spine: Secondary | ICD-10-CM | POA: Diagnosis not present

## 2019-09-21 DIAGNOSIS — M542 Cervicalgia: Secondary | ICD-10-CM | POA: Diagnosis not present

## 2019-09-21 DIAGNOSIS — M26622 Arthralgia of left temporomandibular joint: Secondary | ICD-10-CM | POA: Diagnosis not present

## 2019-09-21 DIAGNOSIS — R51 Headache with orthostatic component, not elsewhere classified: Secondary | ICD-10-CM | POA: Diagnosis not present

## 2019-10-02 DIAGNOSIS — B029 Zoster without complications: Secondary | ICD-10-CM | POA: Diagnosis not present

## 2019-10-10 DIAGNOSIS — M542 Cervicalgia: Secondary | ICD-10-CM | POA: Diagnosis not present

## 2019-10-10 DIAGNOSIS — M461 Sacroiliitis, not elsewhere classified: Secondary | ICD-10-CM | POA: Diagnosis not present

## 2019-10-10 DIAGNOSIS — M25532 Pain in left wrist: Secondary | ICD-10-CM | POA: Diagnosis not present

## 2019-10-10 DIAGNOSIS — M546 Pain in thoracic spine: Secondary | ICD-10-CM | POA: Diagnosis not present

## 2019-10-31 DIAGNOSIS — G43109 Migraine with aura, not intractable, without status migrainosus: Secondary | ICD-10-CM | POA: Diagnosis not present

## 2019-10-31 DIAGNOSIS — M546 Pain in thoracic spine: Secondary | ICD-10-CM | POA: Diagnosis not present

## 2019-10-31 DIAGNOSIS — M461 Sacroiliitis, not elsewhere classified: Secondary | ICD-10-CM | POA: Diagnosis not present

## 2019-10-31 DIAGNOSIS — M542 Cervicalgia: Secondary | ICD-10-CM | POA: Diagnosis not present

## 2019-11-01 NOTE — Progress Notes (Signed)
Patient with supraorbital neuralgia. Does extremely well with supraorbital nerve blocks.   Performed by Dr. Jaynee Eagles M.D. All procedures a documented blood were medically necessary, reasonable and appropriate based on the patient's history, medical diagnosis and physician opinion. Verbal informed consent was obtained from the patient, patient was informed of potential risk of procedure, including bruising, bleeding, hematoma formation, infection, muscle weakness, muscle pain, numbness, transient hypertension, transient hyperglycemia and transient insomnia among others. All areas injected were topically clean with isopropyl rubbing alcohol. Nonsterile nonlatex gloves were worn during the procedure.  4. Supraorbital nerve block (64400): Supraorbital nerve site was identified along the incision of the frontal bone on the orbital/supraorbital ridge. Medication was injected into the left and right supraorbital nerve areas. Patient's condition is associated with inflammation of the supraorbital and associated muscle groups. Injection was deemed medically necessary, reasonable and appropriate. Injection represents a separate and unique surgical service.

## 2019-11-02 ENCOUNTER — Other Ambulatory Visit: Payer: Self-pay

## 2019-11-02 ENCOUNTER — Ambulatory Visit (INDEPENDENT_AMBULATORY_CARE_PROVIDER_SITE_OTHER): Payer: BC Managed Care – PPO | Admitting: Neurology

## 2019-11-02 DIAGNOSIS — G500 Trigeminal neuralgia: Secondary | ICD-10-CM | POA: Diagnosis not present

## 2019-11-02 NOTE — Progress Notes (Signed)
Nerve block w/o steroid: Pt signed consent  0.5% Bupivocaine 6 mL LOT: [id number] EXP: FEB 2022 NDC: [id number]  2% Lidocaine 6 mL LOT: [id number] EXP: 12/14/2019 NDC: [id number]

## 2019-11-08 DIAGNOSIS — I10 Essential (primary) hypertension: Secondary | ICD-10-CM | POA: Diagnosis not present

## 2019-11-08 DIAGNOSIS — E785 Hyperlipidemia, unspecified: Secondary | ICD-10-CM | POA: Diagnosis not present

## 2019-11-08 DIAGNOSIS — I7300 Raynaud's syndrome without gangrene: Secondary | ICD-10-CM | POA: Diagnosis not present

## 2019-11-08 DIAGNOSIS — G43109 Migraine with aura, not intractable, without status migrainosus: Secondary | ICD-10-CM | POA: Diagnosis not present

## 2019-11-21 DIAGNOSIS — M25532 Pain in left wrist: Secondary | ICD-10-CM | POA: Diagnosis not present

## 2019-11-21 DIAGNOSIS — M542 Cervicalgia: Secondary | ICD-10-CM | POA: Diagnosis not present

## 2019-11-21 DIAGNOSIS — M461 Sacroiliitis, not elsewhere classified: Secondary | ICD-10-CM | POA: Diagnosis not present

## 2019-11-21 DIAGNOSIS — R51 Headache with orthostatic component, not elsewhere classified: Secondary | ICD-10-CM | POA: Diagnosis not present

## 2019-11-21 DIAGNOSIS — M546 Pain in thoracic spine: Secondary | ICD-10-CM | POA: Diagnosis not present

## 2019-12-05 DIAGNOSIS — M546 Pain in thoracic spine: Secondary | ICD-10-CM | POA: Diagnosis not present

## 2019-12-05 DIAGNOSIS — M461 Sacroiliitis, not elsewhere classified: Secondary | ICD-10-CM | POA: Diagnosis not present

## 2019-12-05 DIAGNOSIS — R51 Headache with orthostatic component, not elsewhere classified: Secondary | ICD-10-CM | POA: Diagnosis not present

## 2019-12-05 DIAGNOSIS — M542 Cervicalgia: Secondary | ICD-10-CM | POA: Diagnosis not present

## 2020-01-02 DIAGNOSIS — M546 Pain in thoracic spine: Secondary | ICD-10-CM | POA: Diagnosis not present

## 2020-01-02 DIAGNOSIS — M542 Cervicalgia: Secondary | ICD-10-CM | POA: Diagnosis not present

## 2020-01-02 DIAGNOSIS — M25541 Pain in joints of right hand: Secondary | ICD-10-CM | POA: Diagnosis not present

## 2020-01-02 DIAGNOSIS — M461 Sacroiliitis, not elsewhere classified: Secondary | ICD-10-CM | POA: Diagnosis not present

## 2020-01-04 DIAGNOSIS — E119 Type 2 diabetes mellitus without complications: Secondary | ICD-10-CM | POA: Diagnosis not present

## 2020-01-18 DIAGNOSIS — M461 Sacroiliitis, not elsewhere classified: Secondary | ICD-10-CM | POA: Diagnosis not present

## 2020-01-18 DIAGNOSIS — M546 Pain in thoracic spine: Secondary | ICD-10-CM | POA: Diagnosis not present

## 2020-01-18 DIAGNOSIS — M542 Cervicalgia: Secondary | ICD-10-CM | POA: Diagnosis not present

## 2020-01-18 DIAGNOSIS — M79641 Pain in right hand: Secondary | ICD-10-CM | POA: Diagnosis not present

## 2020-01-30 DIAGNOSIS — M545 Low back pain: Secondary | ICD-10-CM | POA: Diagnosis not present

## 2020-01-30 DIAGNOSIS — M546 Pain in thoracic spine: Secondary | ICD-10-CM | POA: Diagnosis not present

## 2020-01-30 DIAGNOSIS — M25542 Pain in joints of left hand: Secondary | ICD-10-CM | POA: Diagnosis not present

## 2020-02-01 DIAGNOSIS — E790 Hyperuricemia without signs of inflammatory arthritis and tophaceous disease: Secondary | ICD-10-CM | POA: Diagnosis not present

## 2020-02-01 DIAGNOSIS — E161 Other hypoglycemia: Secondary | ICD-10-CM | POA: Diagnosis not present

## 2020-02-01 DIAGNOSIS — E039 Hypothyroidism, unspecified: Secondary | ICD-10-CM | POA: Diagnosis not present

## 2020-02-01 DIAGNOSIS — E1169 Type 2 diabetes mellitus with other specified complication: Secondary | ICD-10-CM | POA: Diagnosis not present

## 2020-02-01 DIAGNOSIS — Z79899 Other long term (current) drug therapy: Secondary | ICD-10-CM | POA: Diagnosis not present

## 2020-02-01 DIAGNOSIS — I10 Essential (primary) hypertension: Secondary | ICD-10-CM | POA: Diagnosis not present

## 2020-02-01 DIAGNOSIS — E785 Hyperlipidemia, unspecified: Secondary | ICD-10-CM | POA: Diagnosis not present

## 2020-02-09 DIAGNOSIS — E1169 Type 2 diabetes mellitus with other specified complication: Secondary | ICD-10-CM | POA: Diagnosis not present

## 2020-02-09 DIAGNOSIS — H60541 Acute eczematoid otitis externa, right ear: Secondary | ICD-10-CM | POA: Diagnosis not present

## 2020-02-09 DIAGNOSIS — E785 Hyperlipidemia, unspecified: Secondary | ICD-10-CM | POA: Diagnosis not present

## 2020-02-09 DIAGNOSIS — I10 Essential (primary) hypertension: Secondary | ICD-10-CM | POA: Diagnosis not present

## 2020-02-13 DIAGNOSIS — R51 Headache with orthostatic component, not elsewhere classified: Secondary | ICD-10-CM | POA: Diagnosis not present

## 2020-02-13 DIAGNOSIS — M546 Pain in thoracic spine: Secondary | ICD-10-CM | POA: Diagnosis not present

## 2020-02-13 DIAGNOSIS — M545 Low back pain: Secondary | ICD-10-CM | POA: Diagnosis not present

## 2020-02-13 DIAGNOSIS — M542 Cervicalgia: Secondary | ICD-10-CM | POA: Diagnosis not present

## 2020-02-16 DIAGNOSIS — M3501 Sicca syndrome with keratoconjunctivitis: Secondary | ICD-10-CM | POA: Diagnosis not present

## 2020-02-16 DIAGNOSIS — I7300 Raynaud's syndrome without gangrene: Secondary | ICD-10-CM | POA: Diagnosis not present

## 2020-02-17 ENCOUNTER — Other Ambulatory Visit: Payer: Self-pay | Admitting: Neurology

## 2020-02-29 DIAGNOSIS — M25541 Pain in joints of right hand: Secondary | ICD-10-CM | POA: Diagnosis not present

## 2020-02-29 DIAGNOSIS — M546 Pain in thoracic spine: Secondary | ICD-10-CM | POA: Diagnosis not present

## 2020-02-29 DIAGNOSIS — M461 Sacroiliitis, not elsewhere classified: Secondary | ICD-10-CM | POA: Diagnosis not present

## 2020-02-29 DIAGNOSIS — M5412 Radiculopathy, cervical region: Secondary | ICD-10-CM | POA: Diagnosis not present

## 2020-03-19 DIAGNOSIS — M542 Cervicalgia: Secondary | ICD-10-CM | POA: Diagnosis not present

## 2020-03-19 DIAGNOSIS — M461 Sacroiliitis, not elsewhere classified: Secondary | ICD-10-CM | POA: Diagnosis not present

## 2020-03-19 DIAGNOSIS — M546 Pain in thoracic spine: Secondary | ICD-10-CM | POA: Diagnosis not present

## 2020-03-19 DIAGNOSIS — R51 Headache with orthostatic component, not elsewhere classified: Secondary | ICD-10-CM | POA: Diagnosis not present

## 2020-03-19 NOTE — Progress Notes (Signed)
Patient with supraorbital neuralgia. Does extremely well with supraorbital nerve blocks. CHARGE BY TIME NOT PROCEDURE CODE.   Performed by Dr. Jaynee Eagles M.D. All procedures a documented blood were medically necessary, reasonable and appropriate based on the patient's history, medical diagnosis and physician opinion. Verbal informed consent was obtained from the patient, patient was informed of potential risk of procedure, including bruising, bleeding, hematoma formation, infection, muscle weakness, muscle pain, numbness, transient hypertension, transient hyperglycemia and transient insomnia among others. All areas injected were topically clean with isopropyl rubbing alcohol. Nonsterile nonlatex gloves were worn during the procedure.  Supraorbital nerve block : Supraorbital nerve site was identified along the incision of the frontal bone on the orbital/supraorbital ridge. Medication was injected into the left and right supraorbital nerve areas. Patient's condition is associated with inflammation of the supraorbital and associated muscle groups. Injection was deemed medically necessary, reasonable and appropriate. Injection represents a separate and unique surgical service.  I spent over 20  minutes of face-to-face and non-face-to-face time with patient on the  1. Supraorbital neuralgia    diagnosis.  This included previsit chart review, lab review, study review, order entry, electronic health record documentation, patient education on the different diagnostic and therapeutic options, counseling and coordination of care, risks and benefits of management, compliance, or risk factor reduction

## 2020-03-20 ENCOUNTER — Ambulatory Visit: Payer: BC Managed Care – PPO | Admitting: Neurology

## 2020-03-20 ENCOUNTER — Other Ambulatory Visit: Payer: Self-pay

## 2020-03-20 ENCOUNTER — Encounter: Payer: Self-pay | Admitting: Neurology

## 2020-03-20 VITALS — BP 143/89 | HR 85

## 2020-03-20 DIAGNOSIS — G500 Trigeminal neuralgia: Secondary | ICD-10-CM | POA: Diagnosis not present

## 2020-03-20 NOTE — Progress Notes (Signed)
Nerve block w/o steroid: Pt signed consent   0.5% Bupivocaine 6 mL LOT: [id number] EXP: 02/13/2021 NDC: [id number]  2% Lidocaine 6 mL LOT: [id number] EXP: 05/15/2020 NDC: 0970-4492-52

## 2020-03-29 DIAGNOSIS — Z23 Encounter for immunization: Secondary | ICD-10-CM | POA: Diagnosis not present

## 2020-04-02 DIAGNOSIS — M461 Sacroiliitis, not elsewhere classified: Secondary | ICD-10-CM | POA: Diagnosis not present

## 2020-04-02 DIAGNOSIS — M546 Pain in thoracic spine: Secondary | ICD-10-CM | POA: Diagnosis not present

## 2020-04-02 DIAGNOSIS — R51 Headache with orthostatic component, not elsewhere classified: Secondary | ICD-10-CM | POA: Diagnosis not present

## 2020-04-02 DIAGNOSIS — M542 Cervicalgia: Secondary | ICD-10-CM | POA: Diagnosis not present

## 2020-04-06 ENCOUNTER — Other Ambulatory Visit: Payer: Self-pay | Admitting: Neurology

## 2020-04-16 DIAGNOSIS — M546 Pain in thoracic spine: Secondary | ICD-10-CM | POA: Diagnosis not present

## 2020-04-16 DIAGNOSIS — M62838 Other muscle spasm: Secondary | ICD-10-CM | POA: Diagnosis not present

## 2020-04-16 DIAGNOSIS — M5412 Radiculopathy, cervical region: Secondary | ICD-10-CM | POA: Diagnosis not present

## 2020-04-16 DIAGNOSIS — M461 Sacroiliitis, not elsewhere classified: Secondary | ICD-10-CM | POA: Diagnosis not present

## 2020-04-18 DIAGNOSIS — H02843 Edema of right eye, unspecified eyelid: Secondary | ICD-10-CM | POA: Diagnosis not present

## 2020-04-18 DIAGNOSIS — E1169 Type 2 diabetes mellitus with other specified complication: Secondary | ICD-10-CM | POA: Diagnosis not present

## 2020-04-19 DIAGNOSIS — S0010XA Contusion of unspecified eyelid and periocular area, initial encounter: Secondary | ICD-10-CM | POA: Diagnosis not present

## 2020-04-25 DIAGNOSIS — G43109 Migraine with aura, not intractable, without status migrainosus: Secondary | ICD-10-CM | POA: Diagnosis not present

## 2020-04-29 DIAGNOSIS — M461 Sacroiliitis, not elsewhere classified: Secondary | ICD-10-CM | POA: Diagnosis not present

## 2020-04-29 DIAGNOSIS — M546 Pain in thoracic spine: Secondary | ICD-10-CM | POA: Diagnosis not present

## 2020-04-29 DIAGNOSIS — R51 Headache with orthostatic component, not elsewhere classified: Secondary | ICD-10-CM | POA: Diagnosis not present

## 2020-04-29 DIAGNOSIS — G5601 Carpal tunnel syndrome, right upper limb: Secondary | ICD-10-CM | POA: Diagnosis not present

## 2020-05-15 DIAGNOSIS — G5601 Carpal tunnel syndrome, right upper limb: Secondary | ICD-10-CM | POA: Diagnosis not present

## 2020-05-15 DIAGNOSIS — M546 Pain in thoracic spine: Secondary | ICD-10-CM | POA: Diagnosis not present

## 2020-05-15 DIAGNOSIS — M542 Cervicalgia: Secondary | ICD-10-CM | POA: Diagnosis not present

## 2020-05-15 DIAGNOSIS — M461 Sacroiliitis, not elsewhere classified: Secondary | ICD-10-CM | POA: Diagnosis not present

## 2020-05-27 DIAGNOSIS — M461 Sacroiliitis, not elsewhere classified: Secondary | ICD-10-CM | POA: Diagnosis not present

## 2020-05-27 DIAGNOSIS — M542 Cervicalgia: Secondary | ICD-10-CM | POA: Diagnosis not present

## 2020-05-27 DIAGNOSIS — M546 Pain in thoracic spine: Secondary | ICD-10-CM | POA: Diagnosis not present

## 2020-05-27 DIAGNOSIS — M25541 Pain in joints of right hand: Secondary | ICD-10-CM | POA: Diagnosis not present

## 2020-06-04 DIAGNOSIS — B029 Zoster without complications: Secondary | ICD-10-CM | POA: Diagnosis not present

## 2020-06-05 DIAGNOSIS — S0010XA Contusion of unspecified eyelid and periocular area, initial encounter: Secondary | ICD-10-CM | POA: Diagnosis not present

## 2020-06-12 DIAGNOSIS — M461 Sacroiliitis, not elsewhere classified: Secondary | ICD-10-CM | POA: Diagnosis not present

## 2020-06-12 DIAGNOSIS — M25541 Pain in joints of right hand: Secondary | ICD-10-CM | POA: Diagnosis not present

## 2020-06-12 DIAGNOSIS — M542 Cervicalgia: Secondary | ICD-10-CM | POA: Diagnosis not present

## 2020-06-12 DIAGNOSIS — M546 Pain in thoracic spine: Secondary | ICD-10-CM | POA: Diagnosis not present

## 2020-06-18 DIAGNOSIS — M25541 Pain in joints of right hand: Secondary | ICD-10-CM | POA: Diagnosis not present

## 2020-06-18 DIAGNOSIS — M546 Pain in thoracic spine: Secondary | ICD-10-CM | POA: Diagnosis not present

## 2020-06-18 DIAGNOSIS — M461 Sacroiliitis, not elsewhere classified: Secondary | ICD-10-CM | POA: Diagnosis not present

## 2020-06-18 DIAGNOSIS — M542 Cervicalgia: Secondary | ICD-10-CM | POA: Diagnosis not present

## 2020-07-17 DIAGNOSIS — M5459 Other low back pain: Secondary | ICD-10-CM | POA: Diagnosis not present

## 2020-07-17 DIAGNOSIS — M25531 Pain in right wrist: Secondary | ICD-10-CM | POA: Diagnosis not present

## 2020-07-17 DIAGNOSIS — M546 Pain in thoracic spine: Secondary | ICD-10-CM | POA: Diagnosis not present

## 2020-07-17 DIAGNOSIS — M542 Cervicalgia: Secondary | ICD-10-CM | POA: Diagnosis not present

## 2020-07-22 DIAGNOSIS — Z1231 Encounter for screening mammogram for malignant neoplasm of breast: Secondary | ICD-10-CM | POA: Diagnosis not present

## 2020-07-29 DIAGNOSIS — E039 Hypothyroidism, unspecified: Secondary | ICD-10-CM | POA: Diagnosis not present

## 2020-07-29 DIAGNOSIS — I10 Essential (primary) hypertension: Secondary | ICD-10-CM | POA: Diagnosis not present

## 2020-07-29 DIAGNOSIS — E1169 Type 2 diabetes mellitus with other specified complication: Secondary | ICD-10-CM | POA: Diagnosis not present

## 2020-07-29 DIAGNOSIS — E785 Hyperlipidemia, unspecified: Secondary | ICD-10-CM | POA: Diagnosis not present

## 2020-07-30 DIAGNOSIS — M5431 Sciatica, right side: Secondary | ICD-10-CM | POA: Diagnosis not present

## 2020-07-30 DIAGNOSIS — M546 Pain in thoracic spine: Secondary | ICD-10-CM | POA: Diagnosis not present

## 2020-07-30 DIAGNOSIS — M461 Sacroiliitis, not elsewhere classified: Secondary | ICD-10-CM | POA: Diagnosis not present

## 2020-07-30 DIAGNOSIS — M25541 Pain in joints of right hand: Secondary | ICD-10-CM | POA: Diagnosis not present

## 2020-08-05 DIAGNOSIS — G5601 Carpal tunnel syndrome, right upper limb: Secondary | ICD-10-CM | POA: Diagnosis not present

## 2020-08-05 DIAGNOSIS — G5603 Carpal tunnel syndrome, bilateral upper limbs: Secondary | ICD-10-CM | POA: Diagnosis not present

## 2020-08-06 DIAGNOSIS — E559 Vitamin D deficiency, unspecified: Secondary | ICD-10-CM | POA: Diagnosis not present

## 2020-08-06 DIAGNOSIS — E039 Hypothyroidism, unspecified: Secondary | ICD-10-CM | POA: Diagnosis not present

## 2020-08-06 DIAGNOSIS — E785 Hyperlipidemia, unspecified: Secondary | ICD-10-CM | POA: Diagnosis not present

## 2020-08-06 DIAGNOSIS — I10 Essential (primary) hypertension: Secondary | ICD-10-CM | POA: Diagnosis not present

## 2020-08-13 DIAGNOSIS — M5459 Other low back pain: Secondary | ICD-10-CM | POA: Diagnosis not present

## 2020-08-13 DIAGNOSIS — M542 Cervicalgia: Secondary | ICD-10-CM | POA: Diagnosis not present

## 2020-08-13 DIAGNOSIS — M546 Pain in thoracic spine: Secondary | ICD-10-CM | POA: Diagnosis not present

## 2020-08-13 DIAGNOSIS — M25541 Pain in joints of right hand: Secondary | ICD-10-CM | POA: Diagnosis not present

## 2020-08-27 DIAGNOSIS — M546 Pain in thoracic spine: Secondary | ICD-10-CM | POA: Diagnosis not present

## 2020-08-27 DIAGNOSIS — M542 Cervicalgia: Secondary | ICD-10-CM | POA: Diagnosis not present

## 2020-08-27 DIAGNOSIS — M461 Sacroiliitis, not elsewhere classified: Secondary | ICD-10-CM | POA: Diagnosis not present

## 2020-08-27 DIAGNOSIS — M25541 Pain in joints of right hand: Secondary | ICD-10-CM | POA: Diagnosis not present

## 2020-08-28 DIAGNOSIS — M546 Pain in thoracic spine: Secondary | ICD-10-CM | POA: Diagnosis not present

## 2020-08-28 DIAGNOSIS — M542 Cervicalgia: Secondary | ICD-10-CM | POA: Diagnosis not present

## 2020-08-28 DIAGNOSIS — M461 Sacroiliitis, not elsewhere classified: Secondary | ICD-10-CM | POA: Diagnosis not present

## 2020-08-28 DIAGNOSIS — M25541 Pain in joints of right hand: Secondary | ICD-10-CM | POA: Diagnosis not present

## 2020-09-04 ENCOUNTER — Other Ambulatory Visit: Payer: Self-pay

## 2020-09-04 ENCOUNTER — Ambulatory Visit (INDEPENDENT_AMBULATORY_CARE_PROVIDER_SITE_OTHER): Payer: BC Managed Care – PPO | Admitting: Gastroenterology

## 2020-09-04 ENCOUNTER — Encounter: Payer: Self-pay | Admitting: Gastroenterology

## 2020-09-04 VITALS — BP 104/80 | HR 96 | Ht 62.0 in | Wt 164.0 lb

## 2020-09-04 DIAGNOSIS — K582 Mixed irritable bowel syndrome: Secondary | ICD-10-CM | POA: Diagnosis not present

## 2020-09-04 DIAGNOSIS — Z8371 Family history of colonic polyps: Secondary | ICD-10-CM | POA: Diagnosis not present

## 2020-09-04 DIAGNOSIS — K9041 Non-celiac gluten sensitivity: Secondary | ICD-10-CM

## 2020-09-04 DIAGNOSIS — Z1211 Encounter for screening for malignant neoplasm of colon: Secondary | ICD-10-CM

## 2020-09-04 NOTE — Progress Notes (Signed)
Chief Complaint: For colonoscopy  Referring Provider:  Ernestene Kiel, MD      ASSESSMENT AND PLAN;   #1. Colorectal cancer screening #2. H/O Gluten sensitivity, no celiac on serology or Bx  #3. FH of colonic polyps - mom and dad (both below 31). #4. IBS with alt diarrhea and constipation. Better with gluten free diet. Neg CT 02/03/2017: neg except small HH, small uterine fibroid. #5. Epi pain. S/P chole. Nl CBC, CMP #6. GERD with small HH   Plan: - Proceed with EGD with SB Bx/colon with miralax. Discussed risks & benefits. Risks including rare perforation req laparotomy, bleeding after bx/polypectomy req blood transfusion, rarely missing neoplasms, risks of anesthesia/sedation. Benefits outweigh the risks. Patient agrees to proceed. All the questions were answered. Consent forms given for review. - Continue pepcid for now - Continue gluten free diet.     HPI:    Chloe Pearson is a 55 y.o. female  Here for screening. Has occ diarrhea and constipation with bloating. When she having diarrhea - 1-2/day, watery, with mucus, with abdominal pain which gets better - then goes towards constipation. More diarrhea with milk and salads. Neg CT 02/03/2017: neg except small HH, small uterine fibroid. Has dry mouth due to Sjogren's, better with increased water intake.  Epi pain with regurgitation. Lot of gas S/P lap chole in past  Had cardiac stress test- neg  All to Splenda   Wt Readings from Last 3 Encounters:  09/04/20 164 lb (74.4 kg)  10/29/17 160 lb 4 oz (72.7 kg)  08/02/17 160 lb (72.6 kg)     Past Medical History:  Diagnosis Date  . Anxiety   . Epilepsy [facility])    age 10 to 44  . High cholesterol   . Hypertension   . Hypertension   . Migraine   . Raynaud's disease   . Shingles    Right ear 09/2019 and October or November 2021 2 times in right eye and in her mouth  . Sjogren's disease ([place])   . Thyroid disease    related to Sjogrens  . Vertigo     Past  Surgical History:  Procedure Laterality Date  . CHOLECYSTECTOMY  10/2007  . cortisone shots for carpal tunnel Bilateral 04/2019  . UPPER GI ENDOSCOPY  09/07/2007   minimal antral gastritis. BX: small bowel muscosa with architechturally normal villi and prominent lymphoid aggregates.    Family History  Problem Relation Age of Onset  . Osteoarthritis Mother   . Diabetes Mother   . Hypertension Mother   . Hyperlipidemia Mother   . Diabetes Father   . Hyperlipidemia Father   . Hypertension Maternal Grandmother   . Hyperlipidemia Maternal Grandmother   . Stroke Maternal Grandmother   . Diabetes Maternal Grandfather   . Hyperlipidemia Maternal Grandfather   . Hyperlipidemia Paternal Grandmother   . Hyperlipidemia Paternal Grandfather   . Stroke Paternal Grandfather   . Cancer Other   . Cancer Cousin   . Diabetes Paternal Aunt   . Diabetes Paternal Uncle   . Migraines Neg Hx     Social History   Tobacco Use  . Smoking status: Former Smoker    Types: Cigarettes    Quit date: 08/13/2001    Years since quitting: 19.0  . Smokeless tobacco: Never Used  Vaping Use  . Vaping Use: Never used  Substance Use Topics  . Alcohol use: Yes    Alcohol/week: 0.0 standard drinks    Comment: rare  . Drug use:  No    Comment: prescription Xanax     Current Outpatient Medications  Medication Sig Dispense Refill  . acetaminophen (TYLENOL) 500 MG tablet Take 1,000 mg by mouth every 6 (six) hours as needed.     Marland Kitchen allopurinol (ZYLOPRIM) 100 MG tablet Take 100 mg by mouth daily.  5  . ALPRAZolam (XANAX) 0.25 MG tablet Take 1 tablet by mouth 2 (two) times daily.    Marland Kitchen amitriptyline (ELAVIL) 10 MG tablet TAKE 2 TABLETS (20 MG TOTAL) BY MOUTH AT BEDTIME. 180 tablet 4  . BYSTOLIC 5 MG tablet 1/2 tab. Twice daly    . divalproex (DEPAKOTE ER) 250 MG 24 hr tablet TAKE 1 TABLET BY MOUTH EVERY DAY 90 tablet 3  . Ergocalciferol (VITAMIN D2) 2000 units TABS Take 1 tablet daily by mouth.    . gabapentin  (NEURONTIN) 300 MG capsule Take 1 capsule (300 mg total) by mouth 3 (three) times daily. 270 capsule 4  . glucose blood test strip 1 each by Other route as needed for other. Use as instructed Accu check    . hydroxychloroquine (PLAQUENIL) 200 MG tablet Take 1 tablet by mouth 2 (two) times daily.     Marland Kitchen levothyroxine (SYNTHROID, LEVOTHROID) 75 MCG tablet Take 75 mcg by mouth daily.  4  . meclizine (ANTIVERT) 25 MG tablet Take 25 mg by mouth as needed for dizziness.    . metFORMIN (GLUCOPHAGE-XR) 500 MG 24 hr tablet Take 1 tablet by mouth 2 (two) times daily.   5  . pravastatin (PRAVACHOL) 20 MG tablet Take 20 mg by mouth daily.  3  . valACYclovir (VALTREX) 500 MG tablet Take 500 mg by mouth daily.    . Vitamin D, Ergocalciferol, (DRISDOL) 50000 units CAPS capsule Take 50,000 Units by mouth as directed. Takes every other week  4  . ondansetron (ZOFRAN) 4 MG tablet Take 4 mg by mouth every 8 (eight) hours as needed for nausea or vomiting. (Patient not taking: Reported on 09/04/2020)     No current facility-administered medications for this visit.    Allergies  Allergen Reactions  . Oxycodone Itching  . Shellfish Allergy Swelling  . Sucralose   . Cefaclor Rash  . Penicillins Rash    Review of Systems:  Constitutional: Denies fever, chills, diaphoresis, appetite change and fatigue.  HEENT: Denies photophobia, eye pain, redness, hearing loss, ear pain, congestion, sore throat, rhinorrhea, sneezing, mouth sores, neck pain, neck stiffness and tinnitus.   Respiratory: Denies SOB, DOE, cough, chest tightness,  and wheezing.   Cardiovascular: Denies chest pain, palpitations and leg swelling.  Genitourinary: Denies dysuria, urgency, frequency, hematuria, flank pain and difficulty urinating.  Musculoskeletal: Denies myalgias, back pain, joint swelling, arthralgias and gait problem.  Skin: No rash.  Neurological: Denies dizziness, seizures, syncope, weakness, light-headedness, numbness and headaches.   Hematological: Denies adenopathy. Easy bruising, personal or family bleeding history  Psychiatric/Behavioral: Has anxiety or depression     Physical Exam:    BP 104/80   Pulse 96   Ht 5\' 2"  (1.575 m)   Wt 164 lb (74.4 kg)   LMP 02/14/2015 (Approximate)   BMI 30.00 kg/m  Filed Weights   09/04/20 1328  Weight: 164 lb (74.4 kg)   Constitutional:  Well-developed, in no acute distress. Psychiatric: Normal mood and affect. Behavior is normal. HEENT: Pupils normal.  Conjunctivae are normal. No scleral icterus. Neck supple.  Cardiovascular: Normal rate, regular rhythm. No edema Pulmonary/chest: Effort normal and breath sounds normal. No wheezing, rales or rhonchi.  Abdominal: Soft, nondistended. Nontender. Bowel sounds active throughout. There are no masses palpable. No hepatomegaly. Rectal:  defered Neurological: Alert and oriented to person place and time. Skin: Skin is warm and dry. No rashes noted.    Carmell Austria, MD 09/04/2020, 1:40 PM  Cc: Ernestene Kiel, MD

## 2020-09-04 NOTE — Patient Instructions (Signed)
If you are age 54 or older, your body mass index should be between 23-30. Your Body mass index is 30 kg/m. If this is out of the aforementioned range listed, please consider follow up with your Primary Care Provider.  If you are age 55 or younger, your body mass index should be between 19-25. Your Body mass index is 30 kg/m. If this is out of the aformentioned range listed, please consider follow up with your Primary Care Provider.   You have been scheduled for an endoscopy. Please follow written instructions given to you at your visit today. If you use inhalers (even only as needed), please bring them with you on the day of your procedure.   Thank you,  Dr. Jackquline Denmark

## 2020-09-10 DIAGNOSIS — M546 Pain in thoracic spine: Secondary | ICD-10-CM | POA: Diagnosis not present

## 2020-09-10 DIAGNOSIS — M62838 Other muscle spasm: Secondary | ICD-10-CM | POA: Diagnosis not present

## 2020-09-10 DIAGNOSIS — M461 Sacroiliitis, not elsewhere classified: Secondary | ICD-10-CM | POA: Diagnosis not present

## 2020-09-10 DIAGNOSIS — M542 Cervicalgia: Secondary | ICD-10-CM | POA: Diagnosis not present

## 2020-09-25 DIAGNOSIS — M546 Pain in thoracic spine: Secondary | ICD-10-CM | POA: Diagnosis not present

## 2020-09-25 DIAGNOSIS — M5459 Other low back pain: Secondary | ICD-10-CM | POA: Diagnosis not present

## 2020-09-25 DIAGNOSIS — M7711 Lateral epicondylitis, right elbow: Secondary | ICD-10-CM | POA: Diagnosis not present

## 2020-09-25 DIAGNOSIS — M542 Cervicalgia: Secondary | ICD-10-CM | POA: Diagnosis not present

## 2020-10-02 DIAGNOSIS — M7711 Lateral epicondylitis, right elbow: Secondary | ICD-10-CM | POA: Diagnosis not present

## 2020-10-02 DIAGNOSIS — M25521 Pain in right elbow: Secondary | ICD-10-CM | POA: Diagnosis not present

## 2020-10-03 ENCOUNTER — Encounter: Payer: Self-pay | Admitting: Gastroenterology

## 2020-10-09 DIAGNOSIS — M25521 Pain in right elbow: Secondary | ICD-10-CM | POA: Diagnosis not present

## 2020-10-09 DIAGNOSIS — M546 Pain in thoracic spine: Secondary | ICD-10-CM | POA: Diagnosis not present

## 2020-10-09 DIAGNOSIS — M5459 Other low back pain: Secondary | ICD-10-CM | POA: Diagnosis not present

## 2020-10-09 DIAGNOSIS — M461 Sacroiliitis, not elsewhere classified: Secondary | ICD-10-CM | POA: Diagnosis not present

## 2020-10-14 ENCOUNTER — Encounter: Payer: Self-pay | Admitting: Gastroenterology

## 2020-10-14 ENCOUNTER — Ambulatory Visit: Payer: Self-pay | Admitting: Neurology

## 2020-10-14 ENCOUNTER — Encounter: Payer: BC Managed Care – PPO | Admitting: Gastroenterology

## 2020-10-14 ENCOUNTER — Ambulatory Visit (AMBULATORY_SURGERY_CENTER): Payer: BC Managed Care – PPO | Admitting: Gastroenterology

## 2020-10-14 ENCOUNTER — Other Ambulatory Visit: Payer: Self-pay | Admitting: Gastroenterology

## 2020-10-14 ENCOUNTER — Other Ambulatory Visit: Payer: Self-pay

## 2020-10-14 VITALS — BP 133/71 | HR 86 | Temp 98.0°F | Resp 13 | Ht 62.0 in | Wt 164.0 lb

## 2020-10-14 DIAGNOSIS — K2970 Gastritis, unspecified, without bleeding: Secondary | ICD-10-CM | POA: Diagnosis not present

## 2020-10-14 DIAGNOSIS — K9041 Non-celiac gluten sensitivity: Secondary | ICD-10-CM

## 2020-10-14 DIAGNOSIS — K317 Polyp of stomach and duodenum: Secondary | ICD-10-CM

## 2020-10-14 DIAGNOSIS — K648 Other hemorrhoids: Secondary | ICD-10-CM

## 2020-10-14 DIAGNOSIS — K5730 Diverticulosis of large intestine without perforation or abscess without bleeding: Secondary | ICD-10-CM

## 2020-10-14 DIAGNOSIS — R1310 Dysphagia, unspecified: Secondary | ICD-10-CM

## 2020-10-14 DIAGNOSIS — K219 Gastro-esophageal reflux disease without esophagitis: Secondary | ICD-10-CM | POA: Diagnosis not present

## 2020-10-14 DIAGNOSIS — K208 Other esophagitis without bleeding: Secondary | ICD-10-CM | POA: Diagnosis not present

## 2020-10-14 DIAGNOSIS — R197 Diarrhea, unspecified: Secondary | ICD-10-CM

## 2020-10-14 DIAGNOSIS — Z1211 Encounter for screening for malignant neoplasm of colon: Secondary | ICD-10-CM

## 2020-10-14 MED ORDER — SODIUM CHLORIDE 0.9 % IV SOLN: 500.0000 mL | Freq: Once | INTRAVENOUS | Status: DC

## 2020-10-14 MED ORDER — DEXTROSE 5 % IV SOLN: INTRAVENOUS | Status: AC

## 2020-10-14 NOTE — Progress Notes (Signed)
Called to room to assist during endoscopic procedure.  Patient ID and intended procedure confirmed with present staff. Received instructions for my participation in the procedure from the performing physician.  

## 2020-10-14 NOTE — Op Note (Signed)
[place] Patient Name: Chloe Pearson Procedure Date: 10/14/2020 1:19 PM MRN: [id number] Endoscopist: Jackquline Denmark , MD Age: 55 Referring MD:  Date of Birth: [birth year] Gender: Female Account #: [id number] Procedure:                Colonoscopy Indications:              Screening for colorectal malignant neoplasm.                            Intermittent diarrhea. Medicines:                Monitored Anesthesia Care Procedure:                Pre-Anesthesia Assessment:                           - Prior to the procedure, a History and Physical                            was performed, and patient medications and                            allergies were reviewed. The patient's tolerance of                            previous anesthesia was also reviewed. The risks                            and benefits of the procedure and the sedation                            options and risks were discussed with the patient.                            All questions were answered, and informed consent                            was obtained. Prior Anticoagulants: The patient has                            taken no previous anticoagulant or antiplatelet                            agents. ASA Grade Assessment: II - A patient with                            mild systemic disease. After reviewing the risks                            and benefits, the patient was deemed in                            satisfactory condition to undergo the procedure.  After obtaining informed consent, the colonoscope                            was passed under direct vision. Throughout the                            procedure, the patient's blood pressure, pulse, and                            oxygen saturations were monitored continuously. The                            Olympus [id number]) Colonoscope was                            introduced through the anus and advanced to the 2                             cm into the ileum. The colonoscopy was performed                            without difficulty. The patient tolerated the                            procedure well. The quality of the bowel                            preparation was good. There was some retained                            greenish fluid throughout the colon. Nevertheless,                            aggressive suctioning and aspiration was performed.                            The terminal ileum, ileocecal valve, appendiceal                            orifice, and rectum were photographed. Scope In: 1:41:10 PM Scope Out: 1:58:33 PM Scope Withdrawal Time: 0 hours 9 minutes 50 seconds  Total Procedure Duration: 0 hours 17 minutes 23 seconds  Findings:                 The colon (entire examined portion) appeared                            normal. Biopsies for histology were taken with a                            cold forceps from the entire colon for evaluation                            of microscopic colitis.  Multiple medium-mouthed diverticula were found in                            the sigmoid colon, few scattered in transverse                            colon and ascending colon.                           Non-bleeding internal hemorrhoids were found during                            retroflexion. The hemorrhoids were small.                           The terminal ileum appeared normal.                           The exam was otherwise without abnormality on                            direct and retroflexion views. Complications:            No immediate complications. Estimated Blood Loss:     Estimated blood loss: none. Impression:               - Mild pancolonic diverticulosis predominantly in                            the sigmoid colon.                           - Non-bleeding internal hemorrhoids.                           - The examined portion of the ileum was  normal.                           - The examination was otherwise normal on direct                            and retroflexion views. Recommendation:           - Patient has a contact number available for                            emergencies. The signs and symptoms of potential                            delayed complications were discussed with the                            patient. Return to normal activities tomorrow.                            Written discharge instructions were provided to the  patient.                           - Resume previous diet.                           - Continue present medications.                           - Await pathology results.                           - Repeat colonoscopy in 10 years for screening                            purposes. Earlier, if with any new problems or                            change in family history.                           - Return to GI clinic in 12 weeks.                           - The findings and recommendations were discussed                            with the designated responsible adult. Jackquline Denmark, MD 10/14/2020 2:07:06 PM This report has been signed electronically.

## 2020-10-14 NOTE — Progress Notes (Signed)
Patient was rechecked CBG 10 minutes after starting D5 at [facility].  Blood glucose had come up from 68 to 88.

## 2020-10-14 NOTE — Op Note (Signed)
[place] Patient Name: Chloe Pearson Procedure Date: 10/14/2020 1:19 PM MRN: [id number] Endoscopist: Jackquline Denmark , MD Age: 55 Referring MD:  Date of Birth: Apr 10, 1966 Gender: Female Account #: [id number] Procedure:                Upper GI endoscopy Indications:              Dysphagia, GERD Medicines:                Monitored Anesthesia Care Procedure:                Pre-Anesthesia Assessment:                           - Prior to the procedure, a History and Physical                            was performed, and patient medications and                            allergies were reviewed. The patient's tolerance of                            previous anesthesia was also reviewed. The risks                            and benefits of the procedure and the sedation                            options and risks were discussed with the patient.                            All questions were answered, and informed consent                            was obtained. Prior Anticoagulants: The patient has                            taken no previous anticoagulant or antiplatelet                            agents. ASA Grade Assessment: II - A patient with                            mild systemic disease. After reviewing the risks                            and benefits, the patient was deemed in                            satisfactory condition to undergo the procedure.                           After obtaining informed consent, the endoscope was  passed under direct vision. Throughout the                            procedure, the patient's blood pressure, pulse, and                            oxygen saturations were monitored continuously. The                            Endoscope was introduced through the mouth, and                            advanced to the second part of duodenum. The upper                            GI endoscopy was accomplished without  difficulty.                            The patient tolerated the procedure well. Scope In: Scope Out: Findings:                 The Z-line was irregular and was found 35 cm from                            the incisors with some frond-like mucosa. Multiple                            biopsies were taken with a cold forceps for                            histology, directed by [facility]. The scope was                            withdrawn. Dilation was performed with a Maloney                            dilator with mild resistance at 50 Fr.                           A small hiatal hernia was present.                           Localized mild inflammation characterized by                            erythema was found in the gastric antrum. Biopsies                            were taken with a cold forceps for histology. There                            was some retained food in the stomach without  outlet obstruction.                           6-8, 2 to 3 mm sessile polyps with no bleeding and                            no stigmata of recent bleeding were found in the                            gastric body. 3 polyps were removed with a cold                            biopsy forceps. Resection and retrieval were                            complete.                           The examined duodenum was normal. Biopsies for                            histology were taken with a cold forceps for                            evaluation of celiac disease. Complications:            No immediate complications. Estimated Blood Loss:     Estimated blood loss: none. Impression:               - Z-line irregular, 35 cm from the incisors.                            Biopsied. Dilated.                           - Small hiatal hernia.                           - Gastritis. Biopsied.                           - A few gastric polyps. Resected and retrieved x 3 Recommendation:           -  Patient has a contact number available for                            emergencies. The signs and symptoms of potential                            delayed complications were discussed with the                            patient. Return to normal activities tomorrow.                            Written discharge instructions were provided to the  patient.                           - Resume previous diet.                           - Continue present medications.                           - Await pathology results.                           - The findings and recommendations were discussed                            with the designated responsible adult. Jackquline Denmark, MD 10/14/2020 2:03:45 PM This report has been signed electronically.

## 2020-10-14 NOTE — Patient Instructions (Addendum)
Handouts given for hiatal hernia, Post dilation diet, gastritis and diverticulosis.  Clear liquids for one hour then soft diet for the rest of today.  Resume previous diet as tolerated, beginning tomorrow 10/15/20.  Return to work on 10/16/20. ( note given )  Use over the counter sore throat lozenges as needed.  YOU HAD AN ENDOSCOPIC PROCEDURE TODAY AT [facility]:   Refer to the procedure report that was given to you for any specific questions about what was found during the examination.  If the procedure report does not answer your questions, please call your gastroenterologist to clarify.  If you requested that your care partner not be given the details of your procedure findings, then the procedure report has been included in a sealed envelope for you to review at your convenience later.  YOU SHOULD EXPECT: Some feelings of bloating in the abdomen. Passage of more gas than usual.  Walking can help get rid of the air that was put into your GI tract during the procedure and reduce the bloating. If you had a lower endoscopy (such as a colonoscopy or flexible sigmoidoscopy) you may notice spotting of blood in your stool or on the toilet paper. If you underwent a bowel prep for your procedure, you may not have a normal bowel movement for a few days.  Please Note:  You might notice some irritation and congestion in your nose or some drainage.  This is from the oxygen used during your procedure.  There is no need for concern and it should clear up in a day or so.  SYMPTOMS TO REPORT IMMEDIATELY:   Following lower endoscopy (colonoscopy):  Excessive amounts of blood in the stool  Significant tenderness or worsening of abdominal pains  Swelling of the abdomen that is new, acute  Fever of 100F or higher  Following upper endoscopy (EGD)  Vomiting of blood or coffee ground material  New chest pain or pain under the shoulder blades  Painful or persistently difficult swallowing  New  shortness of breath  Black, tarry-looking stools For urgent or emergent issues, a gastroenterologist can be reached at any hour by calling [phone]. Do not use MyChart messaging for urgent concerns.    DIET:  Clear liquids until 3 PM.  Then soft diet for 24 hours, advance to regular diet as tolerated starting tomorrow, 10/15/20.   Drink plenty of fluids but you should avoid alcoholic beverages for 24 hours.  ACTIVITY:  You should plan to take it easy for the rest of today and you should NOT DRIVE, or use heavy machinery until tomorrow (because of the sedation medicines used during the test).  Return to work on 10/16/20.  FOLLOW UP: Our staff will call the number listed on your records Wednesday 10/16/20 around 715-8 AM following your procedure to check on you and address any questions or concerns that you may have regarding the information given to you following your procedure. If we do not reach you, we will leave a message.  We will attempt to reach you two times.  During this call, we will ask if you have developed any symptoms of COVID 19. If you develop any symptoms (ie: fever, flu-like symptoms, shortness of breath, cough etc.) before then, please call [phone].  If you test positive for Covid 19 in the 2 weeks post procedure, please call and report this information to [place].    RETURN TO CLINIC IN 12 WEEKS.  If any biopsies were taken you will be  contacted by phone or by letter within the next 1-3 weeks.  Please call us at [phone] if you have not heard about the biopsies in 3 weeks.    SIGNATURES/CONFIDENTIALITY: You and/or your care partner have signed paperwork which will be entered into your electronic medical record.  These signatures attest to the fact that that the information above on your After Visit Summary has been reviewed and is understood.  Full responsibility of the confidentiality of this discharge information lies with you and/or your care-partner.

## 2020-10-14 NOTE — Progress Notes (Signed)
pt tolerated well. VSS. awake and to recovery. Report given to RN.  

## 2020-10-15 ENCOUNTER — Telehealth: Payer: Self-pay | Admitting: Gastroenterology

## 2020-10-15 DIAGNOSIS — Z1211 Encounter for screening for malignant neoplasm of colon: Secondary | ICD-10-CM

## 2020-10-15 NOTE — Telephone Encounter (Signed)
Lets see how she is in the next 24 hours.  No fever chills He needs to call us tomorrow to let us know how she is doing. For now she can take over-the-counter cough syrup If still with greenish sputum, will give her Z-Pak.  Note that she is very sensitive to medicines  RG

## 2020-10-15 NOTE — Telephone Encounter (Signed)
Returned pts call.  She had an EGD/colon yesterday and reports that she has had congestion since her procedure.  She states that she has a productive cough and it is thick, green sputum.  She reports no pain or fever and has been able to eat and drink since procedure.

## 2020-10-15 NOTE — Telephone Encounter (Signed)
Inbound call from patient requesting a call please.  States she has been coughing and chest congestion since her procedure yesterday.  Also has green phlegm with a weird taste.  Please advise.

## 2020-10-15 NOTE — Telephone Encounter (Signed)
Spoke with pt and gave her Dr. Steve Rattler recommendations.  She states she will call us tomorrow with an update.

## 2020-10-16 ENCOUNTER — Telehealth: Payer: Self-pay | Admitting: *Deleted

## 2020-10-16 MED ORDER — AZITHROMYCIN 250 MG PO TABS: ORAL_TABLET | ORAL | 0 refills | Status: DC

## 2020-10-16 MED ORDER — AZITHROMYCIN 500 MG PO TABS: 500.0000 mg | ORAL_TABLET | Freq: Every day | ORAL | 0 refills | Status: DC

## 2020-10-16 NOTE — Telephone Encounter (Signed)
Patient called in with an update. Coughing throughout the night green stuff and taste of blood in throat. But no physical blood. Also, a little rattle noise in upper chest area. Best contact number [phone]

## 2020-10-16 NOTE — Addendum Note (Signed)
Addended by: Randall Hiss B on: 10/16/2020 10:33 AM   Modules accepted: Orders

## 2020-10-16 NOTE — Telephone Encounter (Signed)
  Follow up Call-  Call back number 10/14/2020  Post procedure Call Back phone  # [phone]  Permission to leave phone message Yes  Some recent data might be hidden     Patient questions:  Do you have a fever, pain , or abdominal swelling? No. Pain Score  0 *  Have you tolerated food without any problems? Yes.    Have you been able to return to your normal activities? Yes.    Do you have any questions about your discharge instructions: Diet   No. Medications  No. Follow up visit  No.  Do you have questions or concerns about your Care? No.  Actions: * If pain score is 4 or above: No action needed, pain <4.  1. Have you developed a fever since your procedure? no  2.   Have you had an respiratory symptoms (SOB or cough) since your procedure? no  3.   Have you tested positive for COVID 19 since your procedure no  4.   Have you had any family members/close contacts diagnosed with the COVID 19 since your procedure?  no   If yes to any of these questions please route to Joylene John, RN and Joella Prince, RN

## 2020-10-16 NOTE — Telephone Encounter (Signed)
Left detailed message on pt's machine with Dr. Steve Rattler recommendations.  Z pack sent to pt's pharmacy

## 2020-10-16 NOTE — Telephone Encounter (Signed)
Z-Pak x 1.  Use as directed. (500mg  day 1, then 250 x 4 days) Please use over-the-counter cough syrup twice daily Should get better However, if still with problems -she needs to see Dr. Laqueta Due on Friday. RG

## 2020-10-17 ENCOUNTER — Encounter: Payer: BC Managed Care – PPO | Admitting: Gastroenterology

## 2020-10-21 DIAGNOSIS — R059 Cough, unspecified: Secondary | ICD-10-CM | POA: Diagnosis not present

## 2020-10-21 DIAGNOSIS — T17908D Unspecified foreign body in respiratory tract, part unspecified causing other injury, subsequent encounter: Secondary | ICD-10-CM | POA: Diagnosis not present

## 2020-10-24 DIAGNOSIS — M7711 Lateral epicondylitis, right elbow: Secondary | ICD-10-CM | POA: Diagnosis not present

## 2020-10-24 DIAGNOSIS — M62838 Other muscle spasm: Secondary | ICD-10-CM | POA: Diagnosis not present

## 2020-10-24 DIAGNOSIS — M461 Sacroiliitis, not elsewhere classified: Secondary | ICD-10-CM | POA: Diagnosis not present

## 2020-10-24 DIAGNOSIS — M542 Cervicalgia: Secondary | ICD-10-CM | POA: Diagnosis not present

## 2020-10-24 DIAGNOSIS — M546 Pain in thoracic spine: Secondary | ICD-10-CM | POA: Diagnosis not present

## 2020-11-05 DIAGNOSIS — M542 Cervicalgia: Secondary | ICD-10-CM | POA: Diagnosis not present

## 2020-11-05 DIAGNOSIS — M546 Pain in thoracic spine: Secondary | ICD-10-CM | POA: Diagnosis not present

## 2020-11-05 DIAGNOSIS — M62838 Other muscle spasm: Secondary | ICD-10-CM | POA: Diagnosis not present

## 2020-11-05 DIAGNOSIS — M25521 Pain in right elbow: Secondary | ICD-10-CM | POA: Diagnosis not present

## 2020-11-05 DIAGNOSIS — M461 Sacroiliitis, not elsewhere classified: Secondary | ICD-10-CM | POA: Diagnosis not present

## 2020-11-14 ENCOUNTER — Encounter: Payer: Self-pay | Admitting: Gastroenterology

## 2020-11-15 ENCOUNTER — Encounter: Payer: BC Managed Care – PPO | Admitting: Gastroenterology

## 2020-11-21 DIAGNOSIS — M546 Pain in thoracic spine: Secondary | ICD-10-CM | POA: Diagnosis not present

## 2020-11-21 DIAGNOSIS — M9901 Segmental and somatic dysfunction of cervical region: Secondary | ICD-10-CM | POA: Diagnosis not present

## 2020-11-21 DIAGNOSIS — M9902 Segmental and somatic dysfunction of thoracic region: Secondary | ICD-10-CM | POA: Diagnosis not present

## 2020-11-21 DIAGNOSIS — M542 Cervicalgia: Secondary | ICD-10-CM | POA: Diagnosis not present

## 2020-11-21 DIAGNOSIS — M2669 Other specified disorders of temporomandibular joint: Secondary | ICD-10-CM | POA: Diagnosis not present

## 2020-11-21 DIAGNOSIS — M25521 Pain in right elbow: Secondary | ICD-10-CM | POA: Diagnosis not present

## 2020-12-04 DIAGNOSIS — M25521 Pain in right elbow: Secondary | ICD-10-CM | POA: Diagnosis not present

## 2020-12-18 ENCOUNTER — Ambulatory Visit: Payer: BC Managed Care – PPO | Admitting: Neurology

## 2020-12-18 ENCOUNTER — Encounter: Payer: Self-pay | Admitting: Neurology

## 2020-12-18 VITALS — BP 137/82 | HR 88 | Ht 62.5 in | Wt 168.0 lb

## 2020-12-18 DIAGNOSIS — G43709 Chronic migraine without aura, not intractable, without status migrainosus: Secondary | ICD-10-CM | POA: Diagnosis not present

## 2020-12-18 DIAGNOSIS — G43809 Other migraine, not intractable, without status migrainosus: Secondary | ICD-10-CM

## 2020-12-18 DIAGNOSIS — M542 Cervicalgia: Secondary | ICD-10-CM | POA: Diagnosis not present

## 2020-12-18 DIAGNOSIS — M9901 Segmental and somatic dysfunction of cervical region: Secondary | ICD-10-CM | POA: Diagnosis not present

## 2020-12-18 DIAGNOSIS — G500 Trigeminal neuralgia: Secondary | ICD-10-CM

## 2020-12-18 DIAGNOSIS — M9902 Segmental and somatic dysfunction of thoracic region: Secondary | ICD-10-CM | POA: Diagnosis not present

## 2020-12-18 DIAGNOSIS — M546 Pain in thoracic spine: Secondary | ICD-10-CM | POA: Diagnosis not present

## 2020-12-18 NOTE — Progress Notes (Signed)
Nerve block ( w/o) steroid: Pt signed consent.  0.5% Bupivocaine 9 mL LOT: [id number] EXP: 02/2021 NDC: 48592-763-94  2% Lidocaine 9 mL LOT: [id number] EXP3/2023 NDC: 46190-122-24

## 2020-12-19 DIAGNOSIS — M25521 Pain in right elbow: Secondary | ICD-10-CM | POA: Diagnosis not present

## 2020-12-19 NOTE — Progress Notes (Signed)
Patient with supraorbital neuralgia. Does extremely well with supraorbital nerve blocks.   I spent 20 minutes of face-to-face and non-face-to-face time with patient on the  1. Supraorbital neuralgia   2. Vestibular migraine   3. Chronic migraine without aura without status migrainosus, not intractable    diagnosis.  This included previsit chart review, lab review, study review, order entry, electronic health record documentation, patient education on the different diagnostic and therapeutic options, counseling and coordination of care, risks and benefits of management, compliance, or risk factor reduction   Performed by Dr. Jaynee Eagles M.D. All procedures a documented blood were medically necessary, reasonable and appropriate based on the patient's history, medical diagnosis and physician opinion. Verbal informed consent was obtained from the patient, patient was informed of potential risk of procedure, including bruising, bleeding, hematoma formation, infection, muscle weakness, muscle pain, numbness, transient hypertension, transient hyperglycemia and transient insomnia among others. All areas injected were topically clean with isopropyl rubbing alcohol. Nonsterile nonlatex gloves were worn during the procedure.  4. Supraorbital nerve block (64400): Supraorbital nerve site was identified along the incision of the frontal bone on the orbital/supraorbital ridge. Medication was injected into the left and right supraorbital nerve areas. Patient's condition is associated with inflammation of the supraorbital and associated muscle groups. Injection was deemed medically necessary, reasonable and appropriate. Injection represents a separate and unique surgical service.

## 2021-01-02 DIAGNOSIS — M25521 Pain in right elbow: Secondary | ICD-10-CM | POA: Diagnosis not present

## 2021-01-07 DIAGNOSIS — M546 Pain in thoracic spine: Secondary | ICD-10-CM | POA: Diagnosis not present

## 2021-01-07 DIAGNOSIS — M9901 Segmental and somatic dysfunction of cervical region: Secondary | ICD-10-CM | POA: Diagnosis not present

## 2021-01-07 DIAGNOSIS — M9902 Segmental and somatic dysfunction of thoracic region: Secondary | ICD-10-CM | POA: Diagnosis not present

## 2021-01-07 DIAGNOSIS — M542 Cervicalgia: Secondary | ICD-10-CM | POA: Diagnosis not present

## 2021-01-17 ENCOUNTER — Other Ambulatory Visit: Payer: Self-pay

## 2021-01-17 ENCOUNTER — Encounter: Payer: Self-pay | Admitting: Gastroenterology

## 2021-01-17 ENCOUNTER — Ambulatory Visit (INDEPENDENT_AMBULATORY_CARE_PROVIDER_SITE_OTHER): Payer: BC Managed Care – PPO | Admitting: Gastroenterology

## 2021-01-17 VITALS — BP 118/78 | HR 85 | Ht 62.5 in | Wt 166.0 lb

## 2021-01-17 DIAGNOSIS — K582 Mixed irritable bowel syndrome: Secondary | ICD-10-CM

## 2021-01-17 DIAGNOSIS — K9041 Non-celiac gluten sensitivity: Secondary | ICD-10-CM

## 2021-01-17 MED ORDER — CHOLESTYRAMINE 4 G PO PACK: 4.0000 g | PACK | Freq: Three times a day (TID) | ORAL | 2 refills | Status: DC

## 2021-01-17 NOTE — Progress Notes (Signed)
Chief Complaint: For colonoscopy  Referring Provider:  Ernestene Kiel, MD      ASSESSMENT AND PLAN;   #1.  Nonceliac gluten sensitivity (no celiac on serology or Bx)  #2. FH of colonic polyps - mom and dad (both below 34). #3. IBS with alt diarrhea and constipation. Better with gluten free diet. Neg CT 02/03/2017: neg except small HH, small uterine fibroid. Neg colon with neg colon Bx 11/2020 #4. GERD with small HH   Plan: - Change recall to 5 yrs 10/2025 d/t FH of polyps - Continue pepcid 20 BID x 2 weeks, then QD. -Trial of Cholestramine 4g po qd prn (#30), 2 refills. 2hrs before or after meds. - Continue gluten free diet.     HPI:    Chloe Pearson is a 55 y.o. female  Here for FU  Doing well from GI standpoint.  Occasional heartburn which is better with Pepcid twice a day.  Has more postprandial diarrhea with abdominal bloating intermittently 2-3 Bms/day, more prominently ever since her gallbladder has been taken out.  More diarrhea with milk and solids.  No nocturnal symptoms.  Rare constipation.  Negative GI evaluation as below.  Has dry mouth due to Sjogren's, better with increased water intake.  S/P lap chole in past  Had cardiac stress test- neg  Allergic to Splenda  Past GI procedures:  EGD 10/2020 - Z-line irregular, 35 cm from the incisors. Biopsied. Dilated. - Small hiatal hernia. - Gastritis. Biopsied- neg for HP - A few gastric polyps. Resected and retrieved x 3. Bx-fundic gland polyps - Neg SB bx for celiac.  Colonoscopy 10/2020 - Mild pancolonic diverticulosis predominantly in the sigmoid colon. - Non-bleeding internal hemorrhoids. - The examined portion of the ileum was normal. - The examination was otherwise normal on direct and retroflexion views. - Neg random biopsies for microscopic colitis.   Wt Readings from Last 3 Encounters:  01/17/21 166 lb (75.3 kg)  12/18/20 168 lb (76.2 kg)  10/14/20 164 lb (74.4 kg)     Past Medical  History:  Diagnosis Date   Allergy    Anxiety    Arthritis    Depression    Diabetes mellitus without complication ([place])    pre diabetes   Epilepsy ([place])    age 89 to 34   GERD (gastroesophageal reflux disease)    High cholesterol    Hypertension    Hypertension    Kidney stones 2015   Migraine    Raynaud's disease    Shingles    Right ear 09/2019 and October or November 2021 2 times in right eye and in her mouth   Sjogren's disease ([place])    Thyroid disease    related to Sjogrens   Vertigo     Past Surgical History:  Procedure Laterality Date   CHOLECYSTECTOMY  10/2007   cortisone shots for carpal tunnel Bilateral 04/2019   UPPER GI ENDOSCOPY  09/07/2007   minimal antral gastritis. BX: small bowel muscosa with architechturally normal villi and prominent lymphoid aggregates.    Family History  Problem Relation Age of Onset   Osteoarthritis Mother    Diabetes Mother    Hypertension Mother    Hyperlipidemia Mother    Diabetes Father    Hyperlipidemia Father    Hypertension Maternal Grandmother    Hyperlipidemia Maternal Grandmother    Stroke Maternal Grandmother    Diabetes Maternal Grandfather    Hyperlipidemia Maternal Grandfather    Hyperlipidemia Paternal Grandmother    Hyperlipidemia  Paternal Grandfather    Stroke Paternal Grandfather    Cancer Other    Cancer Cousin    Diabetes Paternal Aunt    Diabetes Paternal Uncle    Migraines Neg Hx     Social History   Tobacco Use   Smoking status: Former    Types: Cigarettes    Quit date: 08/13/2001    Years since quitting: 19.4   Smokeless tobacco: Never  Vaping Use   Vaping Use: Never used  Substance Use Topics   Alcohol use: Yes    Alcohol/week: 0.0 standard drinks    Comment: rare   Drug use: No    Comment: prescription Xanax     Current Outpatient Medications  Medication Sig Dispense Refill   acetaminophen (TYLENOL) 500 MG tablet Take 1,000 mg by mouth every 6 (six) hours as needed.       allopurinol (ZYLOPRIM) 100 MG tablet Take 100 mg by mouth daily.  5   ALPRAZolam (XANAX) 0.25 MG tablet Take 1 tablet by mouth 2 (two) times daily.     amitriptyline (ELAVIL) 10 MG tablet TAKE 2 TABLETS (20 MG TOTAL) BY MOUTH AT BEDTIME. 180 tablet 4   BYSTOLIC 5 MG tablet 1/2 tab. Twice daly     divalproex (DEPAKOTE ER) 250 MG 24 hr tablet TAKE 1 TABLET BY MOUTH EVERY DAY 90 tablet 3   Ergocalciferol (VITAMIN D2) 2000 units TABS Take 1 tablet daily by mouth.     glucose blood test strip 1 each by Other route as needed for other. Use as instructed Accu check     hydroxychloroquine (PLAQUENIL) 200 MG tablet Take 1 tablet by mouth 2 (two) times daily.      levothyroxine (SYNTHROID, LEVOTHROID) 75 MCG tablet Take 75 mcg by mouth daily.  4   meclizine (ANTIVERT) 25 MG tablet Take 25 mg by mouth as needed for dizziness.     metFORMIN (GLUCOPHAGE-XR) 500 MG 24 hr tablet Take 1 tablet by mouth 2 (two) times daily.   5   ondansetron (ZOFRAN) 4 MG tablet Take 4 mg by mouth every 8 (eight) hours as needed for nausea or vomiting.     pravastatin (PRAVACHOL) 20 MG tablet Take 20 mg by mouth daily.  3   valACYclovir (VALTREX) 500 MG tablet Take 500 mg by mouth daily.     Vitamin D, Ergocalciferol, (DRISDOL) 50000 units CAPS capsule Take 50,000 Units by mouth as directed. Takes every other week  4   Current Facility-Administered Medications  Medication Dose Route Frequency Provider Last Rate Last Admin   dextrose 5 % solution   Intravenous Continuous Jackquline Denmark, MD        Allergies  Allergen Reactions   Oxycodone Itching   Shellfish Allergy Swelling   Sucralose    Cefaclor Rash   Penicillins Rash    Review of Systems:  Psychiatric/Behavioral: Has anxiety or depression     Physical Exam:    BP 118/78   Pulse 85   Ht 5' 2.5" (1.588 m)   Wt 166 lb (75.3 kg)   LMP 02/14/2015 (Approximate)   SpO2 97%   BMI 29.88 kg/m  Filed Weights   01/17/21 1604  Weight: 166 lb (75.3 kg)    Constitutional:  Well-developed, in no acute distress. Psychiatric: Normal mood and affect. Behavior is normal. HEENT: Pupils normal.  Conjunctivae are normal. No scleral icterus. Cardiovascular: Normal rate, regular rhythm. No edema Pulmonary/chest: Effort normal and breath sounds normal. No wheezing, rales or rhonchi. Abdominal: Soft,  nondistended. Nontender. Bowel sounds active throughout. There are no masses palpable. No hepatomegaly. Rectal:  defered Neurological: Alert and oriented to person place and time. Skin: Skin is warm and dry. No rashes noted.    Carmell Austria, MD 01/17/2021, 4:11 PM  Cc: Ernestene Kiel, MD

## 2021-01-17 NOTE — Patient Instructions (Addendum)
If you are age 54 or older, your body mass index should be between 23-30. Your Body mass index is 29.88 kg/m. If this is out of the aforementioned range listed, please consider follow up with your Primary Care Provider.  If you are age 1 or younger, your body mass index should be between 19-25. Your Body mass index is 29.88 kg/m. If this is out of the aformentioned range listed, please consider follow up with your Primary Care Provider.   __________________________________________________________  The [facility] providers would like to encourage you to use [facility] to communicate with providers for non-urgent requests or questions.  Due to long hold times on the telephone, sending your provider a message by [facility] may be a faster and more efficient way to get a response.  Please allow 48 business hours for a response.  Please remember that this is for non-urgent requests.   We have sent the following medications to your pharmacy for you to pick up at your convenience: Questran  Continue gluten free diet Continue pepcid 2 times a day for 2 weeks and then daily  Recall colon for 10-2025. You should be getting a letter in the mail as it gets closer. But feel free to call with any questions or concerns.  Thank you,  Dr. Jackquline Denmark

## 2021-01-21 DIAGNOSIS — M5413 Radiculopathy, cervicothoracic region: Secondary | ICD-10-CM | POA: Diagnosis not present

## 2021-01-21 DIAGNOSIS — M9901 Segmental and somatic dysfunction of cervical region: Secondary | ICD-10-CM | POA: Diagnosis not present

## 2021-01-21 DIAGNOSIS — M9902 Segmental and somatic dysfunction of thoracic region: Secondary | ICD-10-CM | POA: Diagnosis not present

## 2021-01-21 DIAGNOSIS — M542 Cervicalgia: Secondary | ICD-10-CM | POA: Diagnosis not present

## 2021-01-23 DIAGNOSIS — M25521 Pain in right elbow: Secondary | ICD-10-CM | POA: Diagnosis not present

## 2021-01-28 DIAGNOSIS — I10 Essential (primary) hypertension: Secondary | ICD-10-CM | POA: Diagnosis not present

## 2021-01-28 DIAGNOSIS — E785 Hyperlipidemia, unspecified: Secondary | ICD-10-CM | POA: Diagnosis not present

## 2021-01-28 DIAGNOSIS — E559 Vitamin D deficiency, unspecified: Secondary | ICD-10-CM | POA: Diagnosis not present

## 2021-01-28 DIAGNOSIS — E039 Hypothyroidism, unspecified: Secondary | ICD-10-CM | POA: Diagnosis not present

## 2021-01-28 DIAGNOSIS — E161 Other hypoglycemia: Secondary | ICD-10-CM | POA: Diagnosis not present

## 2021-01-28 DIAGNOSIS — Z79899 Other long term (current) drug therapy: Secondary | ICD-10-CM | POA: Diagnosis not present

## 2021-01-28 DIAGNOSIS — E1169 Type 2 diabetes mellitus with other specified complication: Secondary | ICD-10-CM | POA: Diagnosis not present

## 2021-01-28 DIAGNOSIS — E790 Hyperuricemia without signs of inflammatory arthritis and tophaceous disease: Secondary | ICD-10-CM | POA: Diagnosis not present

## 2021-02-04 DIAGNOSIS — E039 Hypothyroidism, unspecified: Secondary | ICD-10-CM | POA: Diagnosis not present

## 2021-02-04 DIAGNOSIS — M542 Cervicalgia: Secondary | ICD-10-CM | POA: Diagnosis not present

## 2021-02-04 DIAGNOSIS — R748 Abnormal levels of other serum enzymes: Secondary | ICD-10-CM | POA: Diagnosis not present

## 2021-02-04 DIAGNOSIS — I10 Essential (primary) hypertension: Secondary | ICD-10-CM | POA: Diagnosis not present

## 2021-02-04 DIAGNOSIS — M5413 Radiculopathy, cervicothoracic region: Secondary | ICD-10-CM | POA: Diagnosis not present

## 2021-02-04 DIAGNOSIS — M9901 Segmental and somatic dysfunction of cervical region: Secondary | ICD-10-CM | POA: Diagnosis not present

## 2021-02-04 DIAGNOSIS — M9902 Segmental and somatic dysfunction of thoracic region: Secondary | ICD-10-CM | POA: Diagnosis not present

## 2021-02-04 DIAGNOSIS — E785 Hyperlipidemia, unspecified: Secondary | ICD-10-CM | POA: Diagnosis not present

## 2021-02-07 DIAGNOSIS — R748 Abnormal levels of other serum enzymes: Secondary | ICD-10-CM | POA: Diagnosis not present

## 2021-02-07 DIAGNOSIS — R932 Abnormal findings on diagnostic imaging of liver and biliary tract: Secondary | ICD-10-CM | POA: Diagnosis not present

## 2021-02-07 DIAGNOSIS — K7689 Other specified diseases of liver: Secondary | ICD-10-CM | POA: Diagnosis not present

## 2021-02-19 DIAGNOSIS — M9901 Segmental and somatic dysfunction of cervical region: Secondary | ICD-10-CM | POA: Diagnosis not present

## 2021-02-19 DIAGNOSIS — M5459 Other low back pain: Secondary | ICD-10-CM | POA: Diagnosis not present

## 2021-02-19 DIAGNOSIS — M9903 Segmental and somatic dysfunction of lumbar region: Secondary | ICD-10-CM | POA: Diagnosis not present

## 2021-02-19 DIAGNOSIS — M542 Cervicalgia: Secondary | ICD-10-CM | POA: Diagnosis not present

## 2021-02-25 DIAGNOSIS — M25521 Pain in right elbow: Secondary | ICD-10-CM | POA: Diagnosis not present

## 2021-03-05 DIAGNOSIS — M9901 Segmental and somatic dysfunction of cervical region: Secondary | ICD-10-CM | POA: Diagnosis not present

## 2021-03-05 DIAGNOSIS — M9903 Segmental and somatic dysfunction of lumbar region: Secondary | ICD-10-CM | POA: Diagnosis not present

## 2021-03-05 DIAGNOSIS — M7711 Lateral epicondylitis, right elbow: Secondary | ICD-10-CM | POA: Diagnosis not present

## 2021-03-05 DIAGNOSIS — M542 Cervicalgia: Secondary | ICD-10-CM | POA: Diagnosis not present

## 2021-03-05 DIAGNOSIS — M5459 Other low back pain: Secondary | ICD-10-CM | POA: Diagnosis not present

## 2021-03-25 DIAGNOSIS — M542 Cervicalgia: Secondary | ICD-10-CM | POA: Diagnosis not present

## 2021-03-25 DIAGNOSIS — M546 Pain in thoracic spine: Secondary | ICD-10-CM | POA: Diagnosis not present

## 2021-03-25 DIAGNOSIS — M9901 Segmental and somatic dysfunction of cervical region: Secondary | ICD-10-CM | POA: Diagnosis not present

## 2021-03-25 DIAGNOSIS — M9902 Segmental and somatic dysfunction of thoracic region: Secondary | ICD-10-CM | POA: Diagnosis not present

## 2021-03-26 DIAGNOSIS — H6981 Other specified disorders of Eustachian tube, right ear: Secondary | ICD-10-CM | POA: Diagnosis not present

## 2021-03-26 DIAGNOSIS — J0120 Acute ethmoidal sinusitis, unspecified: Secondary | ICD-10-CM | POA: Diagnosis not present

## 2021-03-27 DIAGNOSIS — M25521 Pain in right elbow: Secondary | ICD-10-CM | POA: Diagnosis not present

## 2021-03-28 DIAGNOSIS — Z23 Encounter for immunization: Secondary | ICD-10-CM | POA: Diagnosis not present

## 2021-04-08 DIAGNOSIS — M546 Pain in thoracic spine: Secondary | ICD-10-CM | POA: Diagnosis not present

## 2021-04-08 DIAGNOSIS — M9901 Segmental and somatic dysfunction of cervical region: Secondary | ICD-10-CM | POA: Diagnosis not present

## 2021-04-08 DIAGNOSIS — M5412 Radiculopathy, cervical region: Secondary | ICD-10-CM | POA: Diagnosis not present

## 2021-04-08 DIAGNOSIS — M9902 Segmental and somatic dysfunction of thoracic region: Secondary | ICD-10-CM | POA: Diagnosis not present

## 2021-04-14 DIAGNOSIS — E1169 Type 2 diabetes mellitus with other specified complication: Secondary | ICD-10-CM | POA: Diagnosis not present

## 2021-04-22 DIAGNOSIS — M5415 Radiculopathy, thoracolumbar region: Secondary | ICD-10-CM | POA: Diagnosis not present

## 2021-04-22 DIAGNOSIS — M9902 Segmental and somatic dysfunction of thoracic region: Secondary | ICD-10-CM | POA: Diagnosis not present

## 2021-04-22 DIAGNOSIS — M9903 Segmental and somatic dysfunction of lumbar region: Secondary | ICD-10-CM | POA: Diagnosis not present

## 2021-04-22 DIAGNOSIS — M5459 Other low back pain: Secondary | ICD-10-CM | POA: Diagnosis not present

## 2021-04-30 DIAGNOSIS — E039 Hypothyroidism, unspecified: Secondary | ICD-10-CM | POA: Diagnosis not present

## 2021-04-30 DIAGNOSIS — E161 Other hypoglycemia: Secondary | ICD-10-CM | POA: Diagnosis not present

## 2021-04-30 DIAGNOSIS — E1169 Type 2 diabetes mellitus with other specified complication: Secondary | ICD-10-CM | POA: Diagnosis not present

## 2021-04-30 DIAGNOSIS — Z79899 Other long term (current) drug therapy: Secondary | ICD-10-CM | POA: Diagnosis not present

## 2021-05-11 DIAGNOSIS — M545 Low back pain, unspecified: Secondary | ICD-10-CM | POA: Diagnosis not present

## 2021-05-11 DIAGNOSIS — M5416 Radiculopathy, lumbar region: Secondary | ICD-10-CM | POA: Diagnosis not present

## 2021-05-12 DIAGNOSIS — M5459 Other low back pain: Secondary | ICD-10-CM | POA: Diagnosis not present

## 2021-05-12 DIAGNOSIS — M9904 Segmental and somatic dysfunction of sacral region: Secondary | ICD-10-CM | POA: Diagnosis not present

## 2021-05-12 DIAGNOSIS — M9903 Segmental and somatic dysfunction of lumbar region: Secondary | ICD-10-CM | POA: Diagnosis not present

## 2021-05-12 DIAGNOSIS — M461 Sacroiliitis, not elsewhere classified: Secondary | ICD-10-CM | POA: Diagnosis not present

## 2021-05-13 DIAGNOSIS — M9904 Segmental and somatic dysfunction of sacral region: Secondary | ICD-10-CM | POA: Diagnosis not present

## 2021-05-13 DIAGNOSIS — M9903 Segmental and somatic dysfunction of lumbar region: Secondary | ICD-10-CM | POA: Diagnosis not present

## 2021-05-13 DIAGNOSIS — M5459 Other low back pain: Secondary | ICD-10-CM | POA: Diagnosis not present

## 2021-05-13 DIAGNOSIS — M461 Sacroiliitis, not elsewhere classified: Secondary | ICD-10-CM | POA: Diagnosis not present

## 2021-05-22 DIAGNOSIS — M5459 Other low back pain: Secondary | ICD-10-CM | POA: Diagnosis not present

## 2021-05-22 DIAGNOSIS — M9904 Segmental and somatic dysfunction of sacral region: Secondary | ICD-10-CM | POA: Diagnosis not present

## 2021-05-22 DIAGNOSIS — M461 Sacroiliitis, not elsewhere classified: Secondary | ICD-10-CM | POA: Diagnosis not present

## 2021-05-22 DIAGNOSIS — M9903 Segmental and somatic dysfunction of lumbar region: Secondary | ICD-10-CM | POA: Diagnosis not present

## 2021-06-03 DIAGNOSIS — M5442 Lumbago with sciatica, left side: Secondary | ICD-10-CM | POA: Diagnosis not present

## 2021-06-03 DIAGNOSIS — M9904 Segmental and somatic dysfunction of sacral region: Secondary | ICD-10-CM | POA: Diagnosis not present

## 2021-06-03 DIAGNOSIS — M461 Sacroiliitis, not elsewhere classified: Secondary | ICD-10-CM | POA: Diagnosis not present

## 2021-06-03 DIAGNOSIS — M9903 Segmental and somatic dysfunction of lumbar region: Secondary | ICD-10-CM | POA: Diagnosis not present

## 2021-06-12 DIAGNOSIS — M47816 Spondylosis without myelopathy or radiculopathy, lumbar region: Secondary | ICD-10-CM | POA: Diagnosis not present

## 2021-06-12 DIAGNOSIS — M48061 Spinal stenosis, lumbar region without neurogenic claudication: Secondary | ICD-10-CM | POA: Diagnosis not present

## 2021-06-12 DIAGNOSIS — M5441 Lumbago with sciatica, right side: Secondary | ICD-10-CM | POA: Diagnosis not present

## 2021-06-17 DIAGNOSIS — Z6828 Body mass index (BMI) 28.0-28.9, adult: Secondary | ICD-10-CM | POA: Diagnosis not present

## 2021-06-17 DIAGNOSIS — M25551 Pain in right hip: Secondary | ICD-10-CM | POA: Diagnosis not present

## 2021-06-17 DIAGNOSIS — M47816 Spondylosis without myelopathy or radiculopathy, lumbar region: Secondary | ICD-10-CM | POA: Diagnosis not present

## 2021-06-17 DIAGNOSIS — M9903 Segmental and somatic dysfunction of lumbar region: Secondary | ICD-10-CM | POA: Diagnosis not present

## 2021-06-17 DIAGNOSIS — M9904 Segmental and somatic dysfunction of sacral region: Secondary | ICD-10-CM | POA: Diagnosis not present

## 2021-06-17 DIAGNOSIS — M51A1 Intervertebral annulus fibrosus defect, small, lumbar region: Secondary | ICD-10-CM | POA: Diagnosis not present

## 2021-06-19 DIAGNOSIS — M9903 Segmental and somatic dysfunction of lumbar region: Secondary | ICD-10-CM | POA: Diagnosis not present

## 2021-06-19 DIAGNOSIS — M25551 Pain in right hip: Secondary | ICD-10-CM | POA: Diagnosis not present

## 2021-06-19 DIAGNOSIS — M51A1 Intervertebral annulus fibrosus defect, small, lumbar region: Secondary | ICD-10-CM | POA: Diagnosis not present

## 2021-06-19 DIAGNOSIS — M9904 Segmental and somatic dysfunction of sacral region: Secondary | ICD-10-CM | POA: Diagnosis not present

## 2021-06-23 DIAGNOSIS — M9904 Segmental and somatic dysfunction of sacral region: Secondary | ICD-10-CM | POA: Diagnosis not present

## 2021-06-23 DIAGNOSIS — M51A1 Intervertebral annulus fibrosus defect, small, lumbar region: Secondary | ICD-10-CM | POA: Diagnosis not present

## 2021-06-23 DIAGNOSIS — M9903 Segmental and somatic dysfunction of lumbar region: Secondary | ICD-10-CM | POA: Diagnosis not present

## 2021-06-23 DIAGNOSIS — M25551 Pain in right hip: Secondary | ICD-10-CM | POA: Diagnosis not present

## 2021-06-26 DIAGNOSIS — M51A1 Intervertebral annulus fibrosus defect, small, lumbar region: Secondary | ICD-10-CM | POA: Diagnosis not present

## 2021-06-26 DIAGNOSIS — M9903 Segmental and somatic dysfunction of lumbar region: Secondary | ICD-10-CM | POA: Diagnosis not present

## 2021-06-26 DIAGNOSIS — M25551 Pain in right hip: Secondary | ICD-10-CM | POA: Diagnosis not present

## 2021-06-26 DIAGNOSIS — M9904 Segmental and somatic dysfunction of sacral region: Secondary | ICD-10-CM | POA: Diagnosis not present

## 2021-06-30 DIAGNOSIS — M9903 Segmental and somatic dysfunction of lumbar region: Secondary | ICD-10-CM | POA: Diagnosis not present

## 2021-06-30 DIAGNOSIS — M51A1 Intervertebral annulus fibrosus defect, small, lumbar region: Secondary | ICD-10-CM | POA: Diagnosis not present

## 2021-06-30 DIAGNOSIS — M25551 Pain in right hip: Secondary | ICD-10-CM | POA: Diagnosis not present

## 2021-06-30 DIAGNOSIS — M9904 Segmental and somatic dysfunction of sacral region: Secondary | ICD-10-CM | POA: Diagnosis not present

## 2021-07-03 DIAGNOSIS — M25551 Pain in right hip: Secondary | ICD-10-CM | POA: Diagnosis not present

## 2021-07-03 DIAGNOSIS — M9904 Segmental and somatic dysfunction of sacral region: Secondary | ICD-10-CM | POA: Diagnosis not present

## 2021-07-03 DIAGNOSIS — M51A1 Intervertebral annulus fibrosus defect, small, lumbar region: Secondary | ICD-10-CM | POA: Diagnosis not present

## 2021-07-03 DIAGNOSIS — M9903 Segmental and somatic dysfunction of lumbar region: Secondary | ICD-10-CM | POA: Diagnosis not present

## 2021-07-05 ENCOUNTER — Other Ambulatory Visit: Payer: Self-pay | Admitting: Neurology

## 2021-07-08 DIAGNOSIS — M5451 Vertebrogenic low back pain: Secondary | ICD-10-CM | POA: Diagnosis not present

## 2021-07-10 DIAGNOSIS — M9904 Segmental and somatic dysfunction of sacral region: Secondary | ICD-10-CM | POA: Diagnosis not present

## 2021-07-10 DIAGNOSIS — M9903 Segmental and somatic dysfunction of lumbar region: Secondary | ICD-10-CM | POA: Diagnosis not present

## 2021-07-10 DIAGNOSIS — M461 Sacroiliitis, not elsewhere classified: Secondary | ICD-10-CM | POA: Diagnosis not present

## 2021-07-10 DIAGNOSIS — M51A1 Intervertebral annulus fibrosus defect, small, lumbar region: Secondary | ICD-10-CM | POA: Diagnosis not present

## 2021-07-17 DIAGNOSIS — M9901 Segmental and somatic dysfunction of cervical region: Secondary | ICD-10-CM | POA: Diagnosis not present

## 2021-07-17 DIAGNOSIS — M542 Cervicalgia: Secondary | ICD-10-CM | POA: Diagnosis not present

## 2021-07-17 DIAGNOSIS — M51A1 Intervertebral annulus fibrosus defect, small, lumbar region: Secondary | ICD-10-CM | POA: Diagnosis not present

## 2021-07-17 DIAGNOSIS — M9903 Segmental and somatic dysfunction of lumbar region: Secondary | ICD-10-CM | POA: Diagnosis not present

## 2021-07-24 DIAGNOSIS — M9903 Segmental and somatic dysfunction of lumbar region: Secondary | ICD-10-CM | POA: Diagnosis not present

## 2021-07-24 DIAGNOSIS — M51A1 Intervertebral annulus fibrosus defect, small, lumbar region: Secondary | ICD-10-CM | POA: Diagnosis not present

## 2021-07-24 DIAGNOSIS — M542 Cervicalgia: Secondary | ICD-10-CM | POA: Diagnosis not present

## 2021-07-24 DIAGNOSIS — M9901 Segmental and somatic dysfunction of cervical region: Secondary | ICD-10-CM | POA: Diagnosis not present

## 2021-07-30 DIAGNOSIS — M5451 Vertebrogenic low back pain: Secondary | ICD-10-CM | POA: Diagnosis not present

## 2021-08-09 ENCOUNTER — Other Ambulatory Visit: Payer: Self-pay | Admitting: Neurology

## 2021-08-13 DIAGNOSIS — M9901 Segmental and somatic dysfunction of cervical region: Secondary | ICD-10-CM | POA: Diagnosis not present

## 2021-08-13 DIAGNOSIS — M542 Cervicalgia: Secondary | ICD-10-CM | POA: Diagnosis not present

## 2021-08-13 DIAGNOSIS — M9903 Segmental and somatic dysfunction of lumbar region: Secondary | ICD-10-CM | POA: Diagnosis not present

## 2021-08-13 DIAGNOSIS — M51A1 Intervertebral annulus fibrosus defect, small, lumbar region: Secondary | ICD-10-CM | POA: Diagnosis not present

## 2021-08-14 ENCOUNTER — Other Ambulatory Visit: Payer: Self-pay | Admitting: Neurology

## 2021-08-21 DIAGNOSIS — Z1231 Encounter for screening mammogram for malignant neoplasm of breast: Secondary | ICD-10-CM | POA: Diagnosis not present

## 2021-08-22 DIAGNOSIS — E039 Hypothyroidism, unspecified: Secondary | ICD-10-CM | POA: Diagnosis not present

## 2021-08-22 DIAGNOSIS — Z79899 Other long term (current) drug therapy: Secondary | ICD-10-CM | POA: Diagnosis not present

## 2021-08-22 DIAGNOSIS — E559 Vitamin D deficiency, unspecified: Secondary | ICD-10-CM | POA: Diagnosis not present

## 2021-08-22 DIAGNOSIS — E790 Hyperuricemia without signs of inflammatory arthritis and tophaceous disease: Secondary | ICD-10-CM | POA: Diagnosis not present

## 2021-08-22 DIAGNOSIS — E1169 Type 2 diabetes mellitus with other specified complication: Secondary | ICD-10-CM | POA: Diagnosis not present

## 2021-08-22 DIAGNOSIS — I10 Essential (primary) hypertension: Secondary | ICD-10-CM | POA: Diagnosis not present

## 2021-08-22 DIAGNOSIS — E785 Hyperlipidemia, unspecified: Secondary | ICD-10-CM | POA: Diagnosis not present

## 2021-08-26 DIAGNOSIS — M542 Cervicalgia: Secondary | ICD-10-CM | POA: Diagnosis not present

## 2021-08-26 DIAGNOSIS — M461 Sacroiliitis, not elsewhere classified: Secondary | ICD-10-CM | POA: Diagnosis not present

## 2021-08-26 DIAGNOSIS — M9901 Segmental and somatic dysfunction of cervical region: Secondary | ICD-10-CM | POA: Diagnosis not present

## 2021-08-26 DIAGNOSIS — M9904 Segmental and somatic dysfunction of sacral region: Secondary | ICD-10-CM | POA: Diagnosis not present

## 2021-09-09 DIAGNOSIS — M9903 Segmental and somatic dysfunction of lumbar region: Secondary | ICD-10-CM | POA: Diagnosis not present

## 2021-09-09 DIAGNOSIS — M546 Pain in thoracic spine: Secondary | ICD-10-CM | POA: Diagnosis not present

## 2021-09-09 DIAGNOSIS — M9902 Segmental and somatic dysfunction of thoracic region: Secondary | ICD-10-CM | POA: Diagnosis not present

## 2021-09-09 DIAGNOSIS — M51A1 Intervertebral annulus fibrosus defect, small, lumbar region: Secondary | ICD-10-CM | POA: Diagnosis not present

## 2021-10-10 ENCOUNTER — Other Ambulatory Visit: Payer: Self-pay | Admitting: Neurology

## 2022-04-15 ENCOUNTER — Ambulatory Visit: Payer: 59 | Admitting: Neurology

## 2022-04-15 VITALS — BP 151/88 | HR 86 | Ht 62.0 in | Wt 161.4 lb

## 2022-04-15 DIAGNOSIS — G500 Trigeminal neuralgia: Secondary | ICD-10-CM | POA: Diagnosis not present

## 2022-04-15 MED ORDER — LIDOCAINE HCL 2 % IJ SOLN: 3.0000 mL | Freq: Once | INTRAMUSCULAR | Status: AC

## 2022-04-15 MED ORDER — BUPIVACAINE HCL 0.5 % IJ SOLN: 6.0000 mL | Freq: Once | INTRAMUSCULAR | Status: AC

## 2022-04-15 NOTE — Progress Notes (Signed)
Nerve block  w/o steroid:without  Pt signed consent yes   0.5% Bupivocaine  6 mL PFR:[id number] EXP: 01/26 NDC: 71994-129-04  2% Lidocaine 3 mL LOT: [id number] EXP: 07/2022 NDC: 92178-375-42

## 2022-04-15 NOTE — Patient Instructions (Addendum)
Patient with supraorbital neuralgia. Does extremely well with supraorbital nerve blocks.   I spent 10 minutes of face-to-face and non-face-to-face time with patient on the  1. Supraorbital neuralgia    diagnosis.  This included previsit chart review, lab review, study review, order entry, electronic health record documentation, patient education on the different diagnostic and therapeutic options, counseling and coordination of care, risks and benefits of management, compliance, or risk factor reduction   Performed by Dr. Jaynee Eagles M.D. All procedures a documented blood were medically necessary, reasonable and appropriate based on the patient's history, medical diagnosis and physician opinion. Verbal informed consent was obtained from the patient, patient was informed of potential risk of procedure, including bruising, bleeding, hematoma formation, infection, muscle weakness, muscle pain, numbness, transient hypertension, transient hyperglycemia and transient insomnia among others. All areas injected were topically clean with isopropyl rubbing alcohol. Nonsterile nonlatex gloves were worn during the procedure.  4. Supraorbital nerve block (bill by time): Supraorbital nerve site was identified along the incision of the frontal bone on the orbital/supraorbital ridge. Medication was injected into the left and right supraorbital nerve areas. Patient's condition is associated with inflammation of the supraorbital and associated muscle groups. Injection was deemed medically necessary, reasonable and appropriate. Injection represents a separate and unique surgical service.

## 2022-04-20 ENCOUNTER — Ambulatory Visit: Payer: BC Managed Care – PPO | Admitting: Neurology

## 2022-04-29 ENCOUNTER — Ambulatory Visit: Payer: Self-pay | Admitting: Neurology

## 2022-09-21 ENCOUNTER — Other Ambulatory Visit: Payer: Self-pay | Admitting: Neurology

## 2022-10-03 ENCOUNTER — Other Ambulatory Visit: Payer: Self-pay | Admitting: Neurology

## 2023-03-09 ENCOUNTER — Encounter: Payer: Self-pay | Admitting: Neurology

## 2023-03-15 ENCOUNTER — Encounter: Payer: Self-pay | Admitting: Neurology

## 2023-03-15 ENCOUNTER — Ambulatory Visit: Payer: 59 | Admitting: Neurology

## 2023-03-15 VITALS — BP 144/73 | HR 84 | Ht 62.5 in | Wt 165.0 lb

## 2023-03-15 DIAGNOSIS — G510 Bell's palsy: Secondary | ICD-10-CM | POA: Diagnosis not present

## 2023-03-15 DIAGNOSIS — G500 Trigeminal neuralgia: Secondary | ICD-10-CM | POA: Diagnosis not present

## 2023-03-15 DIAGNOSIS — I6521 Occlusion and stenosis of right carotid artery: Secondary | ICD-10-CM | POA: Diagnosis not present

## 2023-03-15 DIAGNOSIS — R29810 Facial weakness: Secondary | ICD-10-CM

## 2023-03-15 MED ORDER — BUPIVACAINE HCL 0.5 % IJ SOLN: 3.0000 mL | Freq: Once | INTRAMUSCULAR | Status: AC

## 2023-03-15 MED ORDER — LIDOCAINE HCL 2 % IJ SOLN: 3.0000 mL | Freq: Once | INTRAMUSCULAR | Status: AC

## 2023-03-15 NOTE — Progress Notes (Signed)
Patient with supraorbital neuralgia. Does extremely well with supraorbital nerve blocks. Last ws in 2022 and has done well now needing another nerve block which improves pain, headache and migraine, vestibular symptoms and quality of life. She gets great benefit from this procedure. Her migraines and neuralgia. Left lower face and lip is droopy for years ongoing and now more droopy no other symptom but recommended MRI of the brain for strokes however this has been ongoing even since a teenager so chronic and possibly worse. No dysphagia, no numbness, no dysarthria. She does have some mild right lip asymmetry slightly drooped on the left upper lip. MRI brain 2017 was normal brain and CTA H&N 2019 showed atherosclerosis of the carotid 35% will order MRI brain stroke protocol as well as carotid ultrasound for evaluation. Exam is nonfocal except for mild dysarthria and left lower facial droop and mile nasolabial fold on the left. Need to evaluate for Multiple Sclorosis and stroke as well as follow on carotid atherosclerosis as an embolic risk.   03-02-2023: BUN 19 Creatinine 0.96  Orders Placed This Encounter  Procedures   MR BRAIN W WO CONTRAST   VAS US CAROTID    I spent 10 minutes of face-to-face and non-face-to-face time with patient on the  1. Supraorbital neuralgia   2. Bell's palsy   3. Facial droop   4. Facial weakness   5. Atherosclerosis of right carotid artery     diagnosis.  This included previsit chart review, lab review, study review, order entry, electronic health record documentation, patient education on the different diagnostic and therapeutic options, counseling and coordination of care, risks and benefits of management, compliance, or risk factor reduction   Performed by Dr. Lucia Gaskins M.D. All procedures a documented blood were medically necessary, reasonable and appropriate based on the patient's history, medical diagnosis and physician opinion. Verbal informed consent was obtained  from the patient, patient was informed of potential risk of procedure, including bruising, bleeding, hematoma formation, infection, muscle weakness, muscle pain, numbness, transient hypertension, transient hyperglycemia and transient insomnia among others. All areas injected were topically clean with isopropyl rubbing alcohol. Nonsterile nonlatex gloves were worn during the procedure.  4. Supraorbital nerve block (64400): Supraorbital nerve site was identified along the incision of the frontal bone on the orbital/supraorbital ridge. Medication was injected into the left and right supraorbital nerve areas. Patient's condition is associated with inflammation of the supraorbital and associated muscle groups. Injection was deemed medically necessary, reasonable and appropriate. Injection represents a separate and unique surgical service.

## 2023-03-15 NOTE — Progress Notes (Signed)
Nerve block   without steroid: Pt signed consent yes   0.5% Bupivocaine 3  mL LOT: [id number] EXP: 04/2025 NDC: 54098-119-14  2% Lidocaine 3 mL LOT: [id number] EXP: 11/2024 NDC: 30865-784-69  Witnessed by Delmer Islam

## 2023-03-22 ENCOUNTER — Telehealth: Payer: Self-pay | Admitting: Neurology

## 2023-03-22 NOTE — Telephone Encounter (Signed)
Pt called to check on status of MRI and ultrasound Carotid due to have not received a call to schedule appts.

## 2023-03-22 NOTE — Telephone Encounter (Signed)
Placed order in Dr Celene Squibb box to be signed

## 2023-03-23 ENCOUNTER — Ambulatory Visit: Payer: 59 | Attending: Neurology

## 2023-03-23 DIAGNOSIS — I6521 Occlusion and stenosis of right carotid artery: Secondary | ICD-10-CM

## 2023-03-23 DIAGNOSIS — G510 Bell's palsy: Secondary | ICD-10-CM

## 2023-03-23 DIAGNOSIS — R29810 Facial weakness: Secondary | ICD-10-CM

## 2023-03-23 DIAGNOSIS — G500 Trigeminal neuralgia: Secondary | ICD-10-CM | POA: Diagnosis not present

## 2023-03-23 NOTE — Telephone Encounter (Signed)
She got the carotid ultrasound done today.  [employer] [facility]kley Harvey: [id number] exp. 03/18/23-05/02/23 sent to [facility]. [phone]

## 2023-04-05 ENCOUNTER — Telehealth: Payer: Self-pay | Admitting: *Deleted

## 2023-04-05 NOTE — Telephone Encounter (Signed)
Received MRI brain w wo results from [facility].   Exam date: 03/31/2023 Impression:  Unremarkable MRI appearance of the brain. No evidence of an acute intracranial abnormality.

## 2023-04-29 ENCOUNTER — Other Ambulatory Visit: Payer: Self-pay | Admitting: Neurology

## 2023-08-04 ENCOUNTER — Ambulatory Visit: Payer: 59 | Admitting: Neurology

## 2023-08-21 ENCOUNTER — Other Ambulatory Visit: Payer: Self-pay | Admitting: Neurology

## 2023-11-24 ENCOUNTER — Telehealth: Payer: Self-pay | Admitting: Neurology

## 2023-11-24 NOTE — Telephone Encounter (Signed)
 Pt called to Schedule appt .  Appt Scheduled

## 2024-03-06 ENCOUNTER — Ambulatory Visit (HOSPITAL_BASED_OUTPATIENT_CLINIC_OR_DEPARTMENT_OTHER)

## 2024-03-15 NOTE — Telephone Encounter (Signed)
 Pt is asking to be called to r/s her appointment with another provider

## 2024-03-16 NOTE — Telephone Encounter (Signed)
 Spoke with patient, she is unable to reschedule at this time but will call back

## 2024-04-19 ENCOUNTER — Ambulatory Visit: Admitting: Neurology

## 2024-04-24 ENCOUNTER — Other Ambulatory Visit: Payer: Self-pay

## 2024-04-24 MED ORDER — AMITRIPTYLINE HCL 10 MG PO TABS: 20.0000 mg | ORAL_TABLET | Freq: Every day | ORAL | 2 refills | Status: DC

## 2024-04-24 NOTE — Telephone Encounter (Signed)
 Former ahern future Dr. Onita

## 2024-06-16 ENCOUNTER — Other Ambulatory Visit: Payer: Self-pay | Admitting: Neurology

## 2024-06-19 NOTE — Telephone Encounter (Signed)
 Last seen on 03/15/23 Follow up scheduled on 08/24/24  Former Dr.Ahern patient are willing to continue Rx? Pt scheduled to see you on 08/24/24  Rx pending.

## 2024-08-24 ENCOUNTER — Ambulatory Visit: Admitting: Neurology
